# Patient Record
Sex: Female | Born: 1955
Health system: Southern US, Community
[De-identification: ages and names within clinical notes are randomized; demographics above are authoritative.]

## PROBLEM LIST (undated history)

## (undated) DIAGNOSIS — R079 Chest pain, unspecified: Secondary | ICD-10-CM

## (undated) DIAGNOSIS — F329 Major depressive disorder, single episode, unspecified: Secondary | ICD-10-CM

## (undated) DIAGNOSIS — F32A Depression, unspecified: Secondary | ICD-10-CM

## (undated) DIAGNOSIS — Z8249 Family history of ischemic heart disease and other diseases of the circulatory system: Secondary | ICD-10-CM

## (undated) DIAGNOSIS — E079 Disorder of thyroid, unspecified: Secondary | ICD-10-CM

## (undated) HISTORY — PX: EYE SURGERY: SHX253

## (undated) HISTORY — DX: Family history of ischemic heart disease and other diseases of the circulatory system: Z82.49

## (undated) HISTORY — PX: CATARACT EXTRACTION: SUR2

## (undated) HISTORY — PX: ABDOMINAL HYSTERECTOMY: SHX81

## (undated) HISTORY — DX: Chest pain, unspecified: R07.9

## (undated) HISTORY — PX: CARPAL TUNNEL RELEASE: SHX101

---

## 1998-08-08 ENCOUNTER — Other Ambulatory Visit: Admission: RE | Admit: 1998-08-08 | Discharge: 1998-08-08 | Payer: Self-pay | Admitting: Obstetrics and Gynecology

## 1998-09-15 ENCOUNTER — Other Ambulatory Visit: Admission: RE | Admit: 1998-09-15 | Discharge: 1998-09-15 | Payer: Self-pay | Admitting: Obstetrics and Gynecology

## 1999-01-31 ENCOUNTER — Ambulatory Visit (HOSPITAL_COMMUNITY): Admission: RE | Admit: 1999-01-31 | Discharge: 1999-01-31 | Payer: Self-pay | Admitting: Obstetrics and Gynecology

## 1999-01-31 ENCOUNTER — Encounter (INDEPENDENT_AMBULATORY_CARE_PROVIDER_SITE_OTHER): Payer: Self-pay | Admitting: Specialist

## 1999-04-10 ENCOUNTER — Encounter (INDEPENDENT_AMBULATORY_CARE_PROVIDER_SITE_OTHER): Payer: Self-pay | Admitting: Specialist

## 1999-04-10 ENCOUNTER — Inpatient Hospital Stay (HOSPITAL_COMMUNITY): Admission: RE | Admit: 1999-04-10 | Discharge: 1999-04-11 | Payer: Self-pay | Admitting: Obstetrics and Gynecology

## 2000-05-20 ENCOUNTER — Encounter: Admission: RE | Admit: 2000-05-20 | Discharge: 2000-05-20 | Payer: Self-pay | Admitting: Endocrinology

## 2000-05-20 ENCOUNTER — Encounter: Payer: Self-pay | Admitting: Endocrinology

## 2001-02-27 ENCOUNTER — Other Ambulatory Visit: Admission: RE | Admit: 2001-02-27 | Discharge: 2001-02-27 | Payer: Self-pay | Admitting: Obstetrics and Gynecology

## 2002-05-15 ENCOUNTER — Other Ambulatory Visit: Admission: RE | Admit: 2002-05-15 | Discharge: 2002-05-15 | Payer: Self-pay | Admitting: Obstetrics and Gynecology

## 2003-02-26 ENCOUNTER — Other Ambulatory Visit: Admission: RE | Admit: 2003-02-26 | Discharge: 2003-02-26 | Payer: Self-pay | Admitting: Obstetrics and Gynecology

## 2003-07-19 ENCOUNTER — Encounter (INDEPENDENT_AMBULATORY_CARE_PROVIDER_SITE_OTHER): Payer: Self-pay | Admitting: *Deleted

## 2003-07-19 ENCOUNTER — Ambulatory Visit (HOSPITAL_COMMUNITY): Admission: RE | Admit: 2003-07-19 | Discharge: 2003-07-19 | Payer: Self-pay | Admitting: *Deleted

## 2004-06-03 ENCOUNTER — Other Ambulatory Visit: Admission: RE | Admit: 2004-06-03 | Discharge: 2004-06-03 | Payer: Self-pay | Admitting: Obstetrics and Gynecology

## 2004-12-04 ENCOUNTER — Encounter: Admission: RE | Admit: 2004-12-04 | Discharge: 2004-12-04 | Payer: Self-pay | Admitting: Endocrinology

## 2007-02-28 ENCOUNTER — Encounter: Admission: RE | Admit: 2007-02-28 | Discharge: 2007-02-28 | Payer: Self-pay | Admitting: Obstetrics and Gynecology

## 2007-11-10 ENCOUNTER — Ambulatory Visit (HOSPITAL_BASED_OUTPATIENT_CLINIC_OR_DEPARTMENT_OTHER): Admission: RE | Admit: 2007-11-10 | Discharge: 2007-11-10 | Payer: Self-pay | Admitting: Orthopedic Surgery

## 2010-04-25 ENCOUNTER — Encounter: Payer: Self-pay | Admitting: Obstetrics and Gynecology

## 2010-04-26 ENCOUNTER — Encounter: Payer: Self-pay | Admitting: Obstetrics and Gynecology

## 2010-04-27 ENCOUNTER — Encounter: Payer: Self-pay | Admitting: Obstetrics and Gynecology

## 2010-08-17 ENCOUNTER — Emergency Department (HOSPITAL_COMMUNITY)
Admission: EM | Admit: 2010-08-17 | Discharge: 2010-08-18 | Disposition: A | Discharge status: Other Acute Inpatient Hospital | Payer: Commercial Managed Care - PPO | Attending: Emergency Medicine | Admitting: Emergency Medicine

## 2010-08-17 DIAGNOSIS — E039 Hypothyroidism, unspecified: Secondary | ICD-10-CM | POA: Insufficient documentation

## 2010-08-17 DIAGNOSIS — R45851 Suicidal ideations: Secondary | ICD-10-CM | POA: Insufficient documentation

## 2010-08-17 DIAGNOSIS — F329 Major depressive disorder, single episode, unspecified: Secondary | ICD-10-CM | POA: Insufficient documentation

## 2010-08-17 DIAGNOSIS — F3289 Other specified depressive episodes: Secondary | ICD-10-CM | POA: Insufficient documentation

## 2010-08-17 DIAGNOSIS — F331 Major depressive disorder, recurrent, moderate: Secondary | ICD-10-CM

## 2010-08-17 LAB — BASIC METABOLIC PANEL WITH GFR
CO2: 26 meq/L (ref 19–32)
Chloride: 104 meq/L (ref 96–112)
Creatinine, Ser: 1 mg/dL (ref 0.4–1.2)
GFR calc Af Amer: >60 mL/min (ref >60)
Glucose, Bld: 104 mg/dL — ABNORMAL HIGH (ref 70–99)

## 2010-08-17 LAB — RAPID URINE DRUG SCREEN, HOSP PERFORMED
Amphetamines: NOT DETECTED
Barbiturates: NOT DETECTED
Benzodiazepines: POSITIVE — AB
Cocaine: NOT DETECTED
Opiates: NOT DETECTED
Tetrahydrocannabinol: NOT DETECTED

## 2010-08-17 LAB — DIFFERENTIAL
Basophils Absolute: 0.1 K/uL (ref 0.0–0.1)
Basophils Relative: 2 % — ABNORMAL HIGH (ref 0–1)
Eosinophils Absolute: 0.1 10*3/uL (ref 0.0–0.7)
Eosinophils Relative: 2 % (ref 0–5)
Lymphocytes Relative: 23 % (ref 12–46)
Lymphs Abs: 1.3 K/uL (ref 0.7–4.0)
Monocytes Absolute: 0.3 K/uL (ref 0.1–1.0)
Monocytes Relative: 5 % (ref 3–12)
Neutro Abs: 3.8 K/uL (ref 1.7–7.7)
Neutrophils Relative %: 69 % (ref 43–77)

## 2010-08-17 LAB — BASIC METABOLIC PANEL
BUN: 18 mg/dL (ref 6–23)
Calcium: 9.2 mg/dL (ref 8.4–10.5)
GFR calc non Af Amer: 58 mL/min — ABNORMAL LOW (ref >60)
Potassium: 4.8 mEq/L (ref 3.5–5.1)
Sodium: 139 mEq/L (ref 135–145)

## 2010-08-17 LAB — CBC
HCT: 41.9 % (ref 36.0–46.0)
Hemoglobin: 14.7 g/dL (ref 12.0–15.0)
MCH: 30.4 pg (ref 26.0–34.0)
MCHC: 35.1 g/dL (ref 30.0–36.0)
MCV: 86.6 fL (ref 78.0–100.0)
Platelets: 333 10*3/uL (ref 150–400)
RBC: 4.84 MIL/uL (ref 3.87–5.11)
RDW: 12.1 % (ref 11.5–15.5)
WBC: 5.5 10*3/uL (ref 4.0–10.5)

## 2010-08-17 LAB — ETHANOL: Alcohol, Ethyl (B): <11 mg/dL — ABNORMAL HIGH (ref 0–10)

## 2010-08-17 LAB — URINALYSIS, ROUTINE W REFLEX MICROSCOPIC
Bilirubin Urine: NEGATIVE
Glucose, UA: NEGATIVE mg/dL
Hgb urine dipstick: NEGATIVE
Ketones, ur: NEGATIVE mg/dL
Nitrite: NEGATIVE
Protein, ur: NEGATIVE mg/dL
Specific Gravity, Urine: 1.033 — ABNORMAL HIGH (ref 1.005–1.030)
Urobilinogen, UA: 0.2 mg/dL (ref 0.0–1.0)
pH: 5.5 (ref 5.0–8.0)

## 2010-08-18 ENCOUNTER — Inpatient Hospital Stay (HOSPITAL_COMMUNITY)
Admission: RE | Admit: 2010-08-18 | Discharge: 2010-08-19 | DRG: 885 | Disposition: A | Discharge status: Home / Self Care | Payer: Commercial Managed Care - PPO | Source: Ambulatory Visit | Attending: Psychiatry | Admitting: Psychiatry

## 2010-08-18 DIAGNOSIS — R45851 Suicidal ideations: Secondary | ICD-10-CM

## 2010-08-18 DIAGNOSIS — E039 Hypothyroidism, unspecified: Secondary | ICD-10-CM | POA: Diagnosis present

## 2010-08-18 DIAGNOSIS — F339 Major depressive disorder, recurrent, unspecified: Secondary | ICD-10-CM

## 2010-08-18 DIAGNOSIS — F332 Major depressive disorder, recurrent severe without psychotic features: Principal | ICD-10-CM | POA: Diagnosis present

## 2010-08-18 DIAGNOSIS — F17200 Nicotine dependence, unspecified, uncomplicated: Secondary | ICD-10-CM | POA: Diagnosis present

## 2010-08-18 NOTE — Op Note (Signed)
NAMEEARNIE, ROCKHOLD              ACCOUNT NO.:  [id number]   MEDICAL RECORD NO.:  [id number]          PATIENT TYPE:  AMB   LOCATION:  DSC                          FACILITY:  [facility]   PHYSICIAN:  Katy Fitch. Sypher, M.D. DATE OF BIRTH:  [birth year]   DATE OF PROCEDURE:  11/10/2007  DATE OF DISCHARGE:                               OPERATIVE REPORT   PREOPERATIVE DIAGNOSIS:  Entrapment neuropathy median nerve right carpal  tunnel.   POSTOPERATIVE DIAGNOSIS:  Entrapment neuropathy median nerve right  carpal tunnel.   OPERATIONS:  Release of right transverse carpal ligament.   SURGEON:  Katy Fitch. Sypher, MD   ASSISTANT:  Annye Rusk PA-C   ANESTHESIA:  General by LMA.   SUPERVISING ANESTHESIOLOGIST:  Burna Forts, MD   INDICATIONS:  Kristina Shelton is a 55 year old woman referred through the  courtesy of Vangie Bicker of [facility] for  evaluation and management of hand numbness and discomfort.   Clinical examination suggested significant carpal tunnel syndrome and  electrodiagnostic studies confirmed median neuropathy at the level of  the wrist.   Due to a failure to respond to nonoperative measures, she is brought to  the operating room at this time for release of right transverse carpal  ligament.   PROCEDURE:  Kristina Shelton is brought to the operating room and placed  in supine position on the operating table.   Following induction of general anesthesia by LMA technique, the right  arm was prepped with Betadine soap solution and sterilely draped.  Upon  exsanguination of right arm with an Esmarch bandage, the arterial  tourniquet was inflated to 220 mmHg.  The procedure commenced with a  short incision in the line of the ring finger of the palm.  Subcutaneous  tissues were carefully divided revealing the palmar fascia.  This split  longitudinally to reveal the common sensory Runkles of the median nerve.  These were followed back to the  transverse carpal ligament which was  gently isolated from the median nerve.  The ligament was then released  along its ulnar border extending into the distal forearm.   This widely opened the carpal canal.  Bleeding points along the margin  of the released ligament were electrocauterized with bipolar forceps  followed by repair of the skin with intradermal 3-0 Prolene suture.   Lidocaine 2% was infiltrated for postoperative analgesia.  The wound was  then dressed with Xeroform, sterile gauze and a volar plaster splint.      Katy Fitch Sypher, M.D.  Electronically Signed     RVS/MEDQ  D:  11/10/2007  T:  11/10/2007  Job:  [id number]   cc:   Vangie Bicker, P.A.  Dr. Juleen China

## 2010-08-19 LAB — T4: T4, Total: 9.3 ug/dL (ref 5.0–12.5)

## 2010-08-19 LAB — TSH: TSH: 12.535 u[IU]/mL — ABNORMAL HIGH (ref 0.350–4.500)

## 2010-08-19 LAB — T3 UPTAKE: T3 Uptake Ratio: 29 % (ref 22.5–37.0)

## 2010-08-21 NOTE — H&P (Signed)
NAMEJERONDA, Kristina Shelton              ACCOUNT NO.:  [id number]  MEDICAL RECORD NO.:  [id number]           PATIENT TYPE:  I  LOCATION:  0500                          FACILITY:  [facility]  PHYSICIAN:  Franchot Gallo, MD     DATE OF BIRTH:  [birth year]  DATE OF ADMISSION:  08/18/2010 DATE OF DISCHARGE:                      PSYCHIATRIC ADMISSION ASSESSMENT   CHIEF COMPLAINT:  "I have been depressed and tried to kill myself."  HISTORY OF PRESENT ILLNESS:  Kristina Shelton is a 55 year old, divorced white female who presents to [facility] for treatment of longstanding depressive symptoms which she states have worsened over the past year. She states that prior to admission, she became sad related to her son "not interacting with her" and sent him an email stating that she was going to "end it all."  She reports that she then placed a gun to her chest but was interrupted by her son who received the email and came to her home to check on her safety.  She reports that her son was able to remove the gun from her hand and transport her to [facility] for admission.  Prior to admission, the patient states that she was sleeping reasonably well with a good appetite but reported moderate to severe feelings of sadness, anhedonia and depressed mood.  Again, she states that her depressive symptoms have worsened over the past year but have been present for many years.  She denies any past suicide attempts or gestures and states that she is unsure if she would have gone through with the attempt to kill herself had her son not intervened.  The patient reports moderate feelings of sadness, anhedonia and depressed mood as well as some feelings of helplessness and hopelessness related to her relationship with her son.  She did report that her son "hugged her" prior to her being admitted, and that has given her some hope that things may work out between the two of them.  The patient denies any past  or current prolonged manic or hypomanic symptoms.  She also denies any past or present auditory or visual hallucinations.  The patient denies any use of alcohol or illicit drugs but does state that she occasionally smokes a cigarette.  She presents for evaluation of the above symptoms.  PAST PSYCHIATRIC HISTORY:  The patient denies any past psychiatric hospitalizations.  She reports that she has taken the antidepressant Zoloft in the past which helped her with her depression but states that it had to be discontinued secondary to GI upset.  PAST MEDICAL HISTORY:  Current medications: 1. Synthroid 150 mcg p.o. q.a.m. 2. Klonopin 0.5 mg p.o. q.h.s. for sleep. 3. Multivitamin p.o. q.a.m. 4. Ibuprofen 200 mg p.o. q.8 hours p.r.n. for pain.  Allergies:  MACROBID - "Severe reaction."  Medical illnesses:  Hypothyroidism.  Past operations: 1. Cervical vertebrae fusion in neck. 2. Partial hysterectomy. 3. Tonsillectomy as child.  FAMILY HISTORY:  The patient's mother is 51 years of age and reportedly has a history of depression.  The patient's father died in 81 from heart disease.  She has one son who is 54 years of age and is  in good health.  She denies any other family history of psychiatric or substance abuse-related illnesses other than her mother.  SOCIAL HISTORY:  The patient was born and raised in [place], [place], and currently lives alone in [place].  She states that she completed the 12th grade of school and currently works as an Control and instrumentation engineer. Again, she is currently divorced and has one child.  She reports very occasionally smokes cigarettes but denies any use of alcohol or illicit drugs.  MENTAL STATUS EXAM:  GENERAL - The patient was alert and oriented x3 and was friendly and cooperative throughout the evaluation.  Speech was appropriate in terms of rate and volume.  Mood appeared moderately depressed today.  Affect was moderately constricted. THOUGHTS - The  patient denied any current auditory or visual hallucinations or delusional thinking.  She also denied any current suicidal or homicidal ideations.  Judgment and insight both appeared fair.  IMPRESSION:    Axis I: 1. Major depressive disorder - Recurrent - Moderate. 2. Dysthymic disorder - Late-onset type. 3. Tobacco abuse. Axis II:  Deferred. Axis III: 1. Hypothyroidism. 2. Status post fusion of cervical vertebrae. 3. Status post partial hysterectomy. 4. Status post tonsillectomy. Axis IV:  Limited primary support system.  Some discord with son. Chronic psychiatric illness. Axis V:  Global Assessment of Functioning at time of admission approximately 40.  Highest Global Assessment of Functioning in past year approximately 60.  PLAN: 1. Since the patient states that she is unable to tolerate the     medication Zoloft, this medication will not be restarted day. 2. The patient was started on the medication Celexa at 20 mg p.o.     q.a.m. to address her depressive symptoms. 3. The patient was continued on the medication Klonopin at 0.5 mg p.o.     q.h.s. as needed for sleep. 4. The patient will be continued on her nonpsychiatric medications of     Synthroid at 150 mcg p.o. q.a.m., multivitamin p.o. q.a.m. and     ibuprofen 200 mg p.o. q.8 hours p.r.n. for pain. 5. Considering the patient's history of hypothyroidism, a TSH, T3     uptake and T4 are also ordered. 6. The patient will continue to be monitored for safety to self and     others. 7. The patient will participate in unit activities per routine.    ___________________________________ Franchot Gallo, MD     RR/MEDQ  D:  08/18/2010  T:  08/18/2010  Job:  [id number]  Electronically Signed by Franchot Gallo MD on 08/21/2010 08:54:14 PM

## 2010-08-21 NOTE — Op Note (Signed)
Kristina Shelton, Kristina Shelton                        ACCOUNT NO.:  [id number]   MEDICAL RECORD NO.:  [id number]                   PATIENT TYPE:  AMB   LOCATION:  ENDO                                 FACILITY:  [facility]   PHYSICIAN:  Georgiana Spinner, M.D.                 DATE OF BIRTH:  1955/09/22   DATE OF PROCEDURE:  07/19/2003  DATE OF DISCHARGE:                                 OPERATIVE REPORT   PROCEDURE:  Upper endoscopy.   INDICATIONS:  Diarrhea, question of celiac disease, reflux, rectal bleeding.   ANESTHESIA:  1. Demerol 75 mg.  2. Versed 7 mg.   DESCRIPTION OF PROCEDURE:  With patient mildly sedated in the left lateral  decubitus position, the Olympus videoscopic pediatric colonoscope was  inserted in the mouth and passed under direct vision through the esophagus  into the stomach, into the duodenum and distally as far as I could get the  scope to pass which was quite distally into the small intestine.  Photographs taken along the way.  Biopsies were taken of the small bowel.  From this point, the colonoscope was then slowly withdrawn, taking  circumferential views of the mucosa visualized until the endoscope then  pulled back into the stomach, placed in retroflexion to view the stomach  from below.  The endoscope was then withdrawn, taking circumferential views  of the remaining gastric and esophagitis mucosa.  The patient's vital signs  and pulse oximeter remained stable.  The patient tolerated the procedure  well without apparent complications.   FINDINGS:  Unremarkable endoscopic examination to the small bowel.   PLAN:  1. Await biopsy report.  The patient will call me for results and follow up     with me as an outpatient.  2. Proceed to colonoscopy as planned.                                               Georgiana Spinner, M.D.    GMO/MEDQ  D:  07/19/2003  T:  07/19/2003  Job:  [id number]

## 2010-08-21 NOTE — Op Note (Signed)
Kristina Shelton, Kristina Shelton                        ACCOUNT NO.:  [id number]   MEDICAL RECORD NO.:  [id number]                   PATIENT TYPE:  AMB   LOCATION:  ENDO                                 FACILITY:  [facility]   PHYSICIAN:  Georgiana Spinner, M.D.                 DATE OF BIRTH:  April 22, 1955   DATE OF PROCEDURE:  07/19/2003  DATE OF DISCHARGE:                                 OPERATIVE REPORT   PROCEDURE:  Colonoscopy.   INDICATIONS:  Diarrhea, rectal bleeding.   DESCRIPTION OF PROCEDURE:  With the patient mildly sedated in the left  lateral decubitus position, the Olympus videoscopic pediatric colonoscope  was inserted in the rectum and passed under direct vision to the cecum,  identified by the base of cecum and the ileocecal valve, both of which were  photographed.  We entered into the terminal ileum, then advanced the  endoscope as far as we could.  Photographs and biopsies taken.  The  endoscope was then withdrawn, taking circumferential views of the remaining  small bowel and colonic mucosa, stopping to take random colonic biopsies  along the way as well until we reached the rectum which appeared normal on  direct, showed hemorrhoids on retroflexed view.  The endoscope was  straightened and withdrawn.  The patient's vital signs and pulse oximeter  remained stable.  The patient tolerated the procedure well without apparent  complications.   FINDINGS:  Internal hemorrhoids, otherwise unremarkable colonoscopic  examination including the terminal ileum.   PLAN:  Await biopsy report.  The patient will call me for results and follow  up with me as an outpatient.                                               Georgiana Spinner, M.D.    GMO/MEDQ  D:  07/19/2003  T:  07/19/2003  Job:  [id number]

## 2011-01-01 LAB — POCT HEMOGLOBIN-HEMACUE: Hemoglobin: 14

## 2012-05-15 ENCOUNTER — Emergency Department (HOSPITAL_COMMUNITY)
Admission: EM | Admit: 2012-05-15 | Discharge: 2012-05-15 | Disposition: A | Discharge status: Home / Self Care | Payer: Commercial Managed Care - PPO | Attending: Emergency Medicine | Admitting: Emergency Medicine

## 2012-05-15 ENCOUNTER — Emergency Department (HOSPITAL_COMMUNITY): Payer: Commercial Managed Care - PPO

## 2012-05-15 ENCOUNTER — Encounter (HOSPITAL_COMMUNITY): Payer: Self-pay | Admitting: *Deleted

## 2012-05-15 DIAGNOSIS — R079 Chest pain, unspecified: Secondary | ICD-10-CM

## 2012-05-15 DIAGNOSIS — F3289 Other specified depressive episodes: Secondary | ICD-10-CM | POA: Insufficient documentation

## 2012-05-15 DIAGNOSIS — Z79899 Other long term (current) drug therapy: Secondary | ICD-10-CM | POA: Insufficient documentation

## 2012-05-15 DIAGNOSIS — R0789 Other chest pain: Secondary | ICD-10-CM | POA: Insufficient documentation

## 2012-05-15 DIAGNOSIS — F329 Major depressive disorder, single episode, unspecified: Secondary | ICD-10-CM | POA: Insufficient documentation

## 2012-05-15 HISTORY — DX: Depression, unspecified: F32.A

## 2012-05-15 HISTORY — DX: Major depressive disorder, single episode, unspecified: F32.9

## 2012-05-15 HISTORY — DX: Disorder of thyroid, unspecified: E07.9

## 2012-05-15 LAB — BASIC METABOLIC PANEL
Chloride: 108 mEq/L (ref 96–112)
GFR calc Af Amer: 88 mL/min — ABNORMAL LOW (ref >90)
GFR calc non Af Amer: 76 mL/min — ABNORMAL LOW (ref >90)
Potassium: 4.1 mEq/L (ref 3.5–5.1)
Sodium: 144 mEq/L (ref 135–145)

## 2012-05-15 LAB — CBC
HCT: 35.3 % — ABNORMAL LOW (ref 36.0–46.0)
MCHC: 35.7 g/dL (ref 30.0–36.0)
Platelets: 336 10*3/uL (ref 150–400)
RDW: 12.4 % (ref 11.5–15.5)
WBC: 5.3 10*3/uL (ref 4.0–10.5)

## 2012-05-15 LAB — HEPATIC FUNCTION PANEL
Albumin: 3.9 g/dL (ref 3.5–5.2)
Alkaline Phosphatase: 104 U/L (ref 39–117)
Total Protein: 6.8 g/dL (ref 6.0–8.3)

## 2012-05-15 LAB — TROPONIN I: Troponin I: <0.3 ng/mL (ref <0.30)

## 2012-05-15 LAB — LIPASE, BLOOD: Lipase: 56 U/L (ref 11–59)

## 2012-05-15 LAB — D-DIMER, QUANTITATIVE: D-Dimer, Quant: <0.27 ug/mL-FEU (ref 0.00–0.48)

## 2012-05-15 MED ORDER — ASPIRIN 81 MG PO CHEW: 324.0000 mg | CHEWABLE_TABLET | Freq: Once | ORAL | Status: DC

## 2012-05-15 NOTE — ED Notes (Signed)
Patient states on/off chest pain x almost 1 year, but over past week chest pain has intensified with pain in central chest running into back. Patient with intermittent episodes of chest pressure

## 2012-05-15 NOTE — ED Notes (Signed)
Pt undressed, in gown, on monitor, continuous pulse oximetry and blood pressure cuff; vitals and EKG being performed 

## 2012-05-15 NOTE — ED Provider Notes (Signed)
History     CSN: [id number]  Arrival date & time 05/15/12  1056   First MD Initiated Contact with Patient 05/15/12 1134      Chief Complaint  Patient presents with  . Chest Pain    (Consider location/radiation/quality/duration/timing/severity/associated sxs/prior treatment) HPI Pt presents with c/o chest pain- she states pain began 3 days ago.  She states it is located in her lower mid chest and radiates to upper back and shoulders.  No nausea, no diaphoressis.  Denies shortness of breath. States she has had similar symptoms several years ago and did not seek medical care.  No fever/cough.  Symptoms are not exertional in nature.  No leg swelling, no hx of DVT/PE, no hormone replacement, no recent travel/trauma/surgery.  She was seen in her PMDs office this morning and was advised to come to the ED for further evaluation.  There are no other associated systemic symptoms, there are no other alleviating or modifying factors.   Past Medical History  Diagnosis Date  . Depression   . Thyroid disease     Past Surgical History  Procedure Laterality Date  . Abdominal hysterectomy    . Cataract extraction    . Carpal tunnel release      No family history on file.  History  Substance Use Topics  . Smoking status: Never Smoker   . Smokeless tobacco: Not on file  . Alcohol Use: No    OB History   Grav Para Term Preterm Abortions TAB SAB Ect Mult Living                  Review of Systems ROS reviewed and all otherwise negative except for mentioned in HPI  Allergies  Macrobid  Home Medications   Current Outpatient Rx  Name  Route  Sig  Dispense  Refill  . Ascorbic Acid (VITAMIN C PO)   Oral   Take 1 tablet by mouth daily.         . calcium carbonate (OS-CAL) 600 MG TABS   Oral   Take 600 mg by mouth daily.         . citalopram (CELEXA) 20 MG tablet   Oral   Take 20 mg by mouth at bedtime.         . clonazePAM (KLONOPIN) 1 MG tablet   Oral   Take 0.5-1 mg  by mouth at bedtime. Take 0.5 tablet Mon - Fri  And take 1 tablet on Sat and Sun.         Marland Kitchen ibuprofen (ADVIL,MOTRIN) 200 MG tablet   Oral   Take 200 mg by mouth every 8 (eight) hours as needed for pain.         Marland Kitchen levothyroxine (SYNTHROID, LEVOTHROID) 150 MCG tablet   Oral   Take 150 mcg by mouth daily.         . LUTEIN PO   Oral   Take 1 tablet by mouth daily.         . Multiple Vitamin (MULTIVITAMIN WITH MINERALS) TABS   Oral   Take 1 tablet by mouth daily.         Marland Kitchen OVER THE COUNTER MEDICATION   Oral   Take 1 tablet by mouth daily as needed. For acid reflux  -  [employer] brand         . VITAMIN E PO   Oral   Take 1 tablet by mouth daily.           BP  109/65  Pulse 85  Temp(Src) 98.5 F (36.9 C) (Oral)  Resp 19  SpO2 98% Vitals reviewed Physical Exam Physical Examination: General appearance - alert, well appearing, and in no distress Mental status - alert, oriented to person, place, and time Eyes - no conjunctival injection, no scleral icterus Mouth - mucous membranes moist, pharynx normal without lesions Chest - clear to auscultation, no wheezes, rales or rhonchi, symmetric air entry Heart - normal rate, regular rhythm, normal S1, S2, no murmurs, rubs, clicks or gallops Abdomen - soft, nontender, nondistended, no masses or organomegaly Extremities - peripheral pulses normal, no pedal edema, no clubbing or cyanosis Skin - normal coloration and turgor, no rashes  ED Course  Procedures (including critical care time)   Date: 05/15/2012  Rate: 84  Rhythm: normal sinus rhythm  QRS Axis: normal  Intervals: normal  ST/T Wave abnormalities: normal  Conduction Disutrbances: none  Narrative Interpretation: unremarkable, right ward axis, no significant changes compared to prior ekg     Labs Reviewed  CBC - Abnormal; Notable for the following:    HCT 35.3 (*)    All other components within normal limits  BASIC METABOLIC PANEL - Abnormal; Notable for  the following:    GFR calc non Af Amer 76 (*)    GFR calc Af Amer 88 (*)    All other components within normal limits  HEPATIC FUNCTION PANEL - Abnormal; Notable for the following:    Total Bilirubin 0.2 (*)    All other components within normal limits  TROPONIN I  D-DIMER, QUANTITATIVE  LIPASE, BLOOD   Dg Chest 2 View  05/15/2012  *RADIOLOGY REPORT*  Clinical Data: Intermittent mid chest pain.  Shortness of breath.  CHEST - 2 VIEW  Comparison: None.  Findings: The heart size is normal.  The lungs are clear.  The visualized soft tissues and bony thorax are unremarkable.  IMPRESSION: Negative chest.   Original Report Authenticated By: Marin Roberts, M.D.      1. Chest pain       MDM  Pt presents with c/o chest pain which has been intermittent over the past year- worse over the past 3 days.  EKG reassuring as well as labs including d-dimer.  CXR shows no acute abnormality.  Also obtained belly labs due to radiation to shoulders but these were normal as well.  Low suspicion for PE, ACS or other acute emergent condition at this time. Discharged with strict return precautions.  Pt agreeable with plan.        Ethelda Chick, MD 05/15/12 [phone]

## 2012-05-20 ENCOUNTER — Other Ambulatory Visit: Payer: Self-pay

## 2012-06-06 ENCOUNTER — Other Ambulatory Visit (HOSPITAL_COMMUNITY): Payer: Self-pay | Admitting: Cardiovascular Disease

## 2012-06-06 DIAGNOSIS — R079 Chest pain, unspecified: Secondary | ICD-10-CM

## 2012-06-20 ENCOUNTER — Ambulatory Visit (HOSPITAL_COMMUNITY)
Admission: RE | Admit: 2012-06-20 | Discharge: 2012-06-20 | Disposition: A | Discharge status: Home / Self Care | Payer: Commercial Managed Care - PPO | Source: Ambulatory Visit | Attending: Cardiovascular Disease | Admitting: Cardiovascular Disease

## 2012-06-20 DIAGNOSIS — R079 Chest pain, unspecified: Secondary | ICD-10-CM | POA: Insufficient documentation

## 2012-06-20 HISTORY — PX: NM MYOVIEW LTD: HXRAD82

## 2012-06-20 MED ORDER — TECHNETIUM TC 99M SESTAMIBI GENERIC - CARDIOLITE
31.0000 | Freq: Once | INTRAVENOUS | Status: AC | PRN
  Administered 2012-06-20: 31 via INTRAVENOUS

## 2012-06-20 MED ORDER — TECHNETIUM TC 99M SESTAMIBI GENERIC - CARDIOLITE
11.0000 | Freq: Once | INTRAVENOUS | Status: AC | PRN
  Administered 2012-06-20: 11 via INTRAVENOUS

## 2012-06-20 NOTE — Procedures (Addendum)
[facility] [address] [place] [address] [phone]  Cardiology Nuclear Med Study  Kristina Shelton is a 57 y.o. female     MRN : [id number]     DOB: 1955-09-29  Procedure Date: 06/20/2012  Nuclear Med Background Indication for Stress Test:  Evaluation for Ischemia History:  no prior cardiac hisory Cardiac Risk Factors: Family History - CAD  Symptoms:  Chest Pain, Dizziness, DOE and Fatigue   Nuclear Pre-Procedure Caffeine/Decaff Intake:  11:00pm NPO After: 11:00am   IV Site: R Hand  IV 0.9% NS with Angio Cath:  22g  Chest Size (in):  n/a IV Started by: Koren Shiver, CNMT  Height: 5\' 2"  (1.575 m)  Cup Size: A  BMI:  Body mass index is 25.23 kg/(m^2). Weight:  138 lb (62.596 kg)   Tech Comments:  n/a    Nuclear Med Study 1 or 2 day study: 1 day  Stress Test Type:  Stress  Order Authorizing Provider:  Thurmon Fair, MD   Resting Radionuclide: Technetium 23m Sestamibi  Resting Radionuclide Dose: 11.0 mCi   Stress Radionuclide:  Technetium 20m Sestamibi  Stress Radionuclide Dose: 31.0 mCi           Stress Protocol Rest HR:72 Stress HR: 157  Rest BP: 105/78 Stress BP: 148/61  Exercise Time (min): 9:46 METS: 11.30          Dose of Adenosine (mg):  n/a Dose of Lexiscan: n/a mg  Dose of Atropine (mg): n/a Dose of Dobutamine: n/a mcg/kg/min (at max HR)  Stress Test Technologist: Ernestene Mention, CCT Nuclear Technologist: Gonzella Lex, CNMT   Rest Procedure:  Myocardial perfusion imaging was performed at rest 45 minutes following the intravenous administration of Technetium 71m Sestamibi. Stress Procedure:  The patient performed treadmill exercise using a Bruce  Protocol for 9 minutes. The patient stopped due to fatigue. Patient denied any chest pain.  There were no significant ST-T wave changes.  Technetium 44m Sestamibi was injected at peak exercise and myocardial perfusion imaging was performed after a brief  delay.  Transient Ischemic Dilatation (Normal <1.22):  0.88 Lung/Heart Ratio (Normal <0.45): 0.37 QGS EDV: 51 ml QGS ESV:  13 ml LV Ejection Fraction: 75%  Signed by      Rest ECG: NSR - Normal EKG  Stress ECG: No significant change from baseline ECG  QPS Raw Data Images:  Normal; no motion artifact; normal heart/lung ratio. Stress Images:  Normal homogeneous uptake in all areas of the myocardium. Rest Images:  Normal homogeneous uptake in all areas of the myocardium. Subtraction (SDS):  Normal  Impression Exercise Capacity:  Good exercise capacity. BP Response:  Normal blood pressure response. Clinical Symptoms:  No significant symptoms noted. ECG Impression:  No significant ST segment change suggestive of ischemia. Comparison with Prior Nuclear Study: No previous nuclear study performed  Overall Impression:  Normal stress nuclear study. Low risk stress nuclear study.  LV Wall Motion:  NL LV Function, EF 75%; NL Wall Motion   KELLY,THOMAS A, MD  06/20/2012 5:53 PM

## 2012-07-31 ENCOUNTER — Encounter: Payer: Self-pay | Admitting: *Deleted

## 2012-08-03 ENCOUNTER — Encounter: Payer: Self-pay | Admitting: Cardiovascular Disease

## 2013-02-08 ENCOUNTER — Other Ambulatory Visit: Payer: Self-pay

## 2014-08-22 IMAGING — CR DG CHEST 2V
2 series · 2 of 2 positions shown · non-contrast
Comparison: None.

CLINICAL DATA: Intermittent mid chest pain.  Shortness of breath.

CHEST - 2 VIEW

[w chest pa]
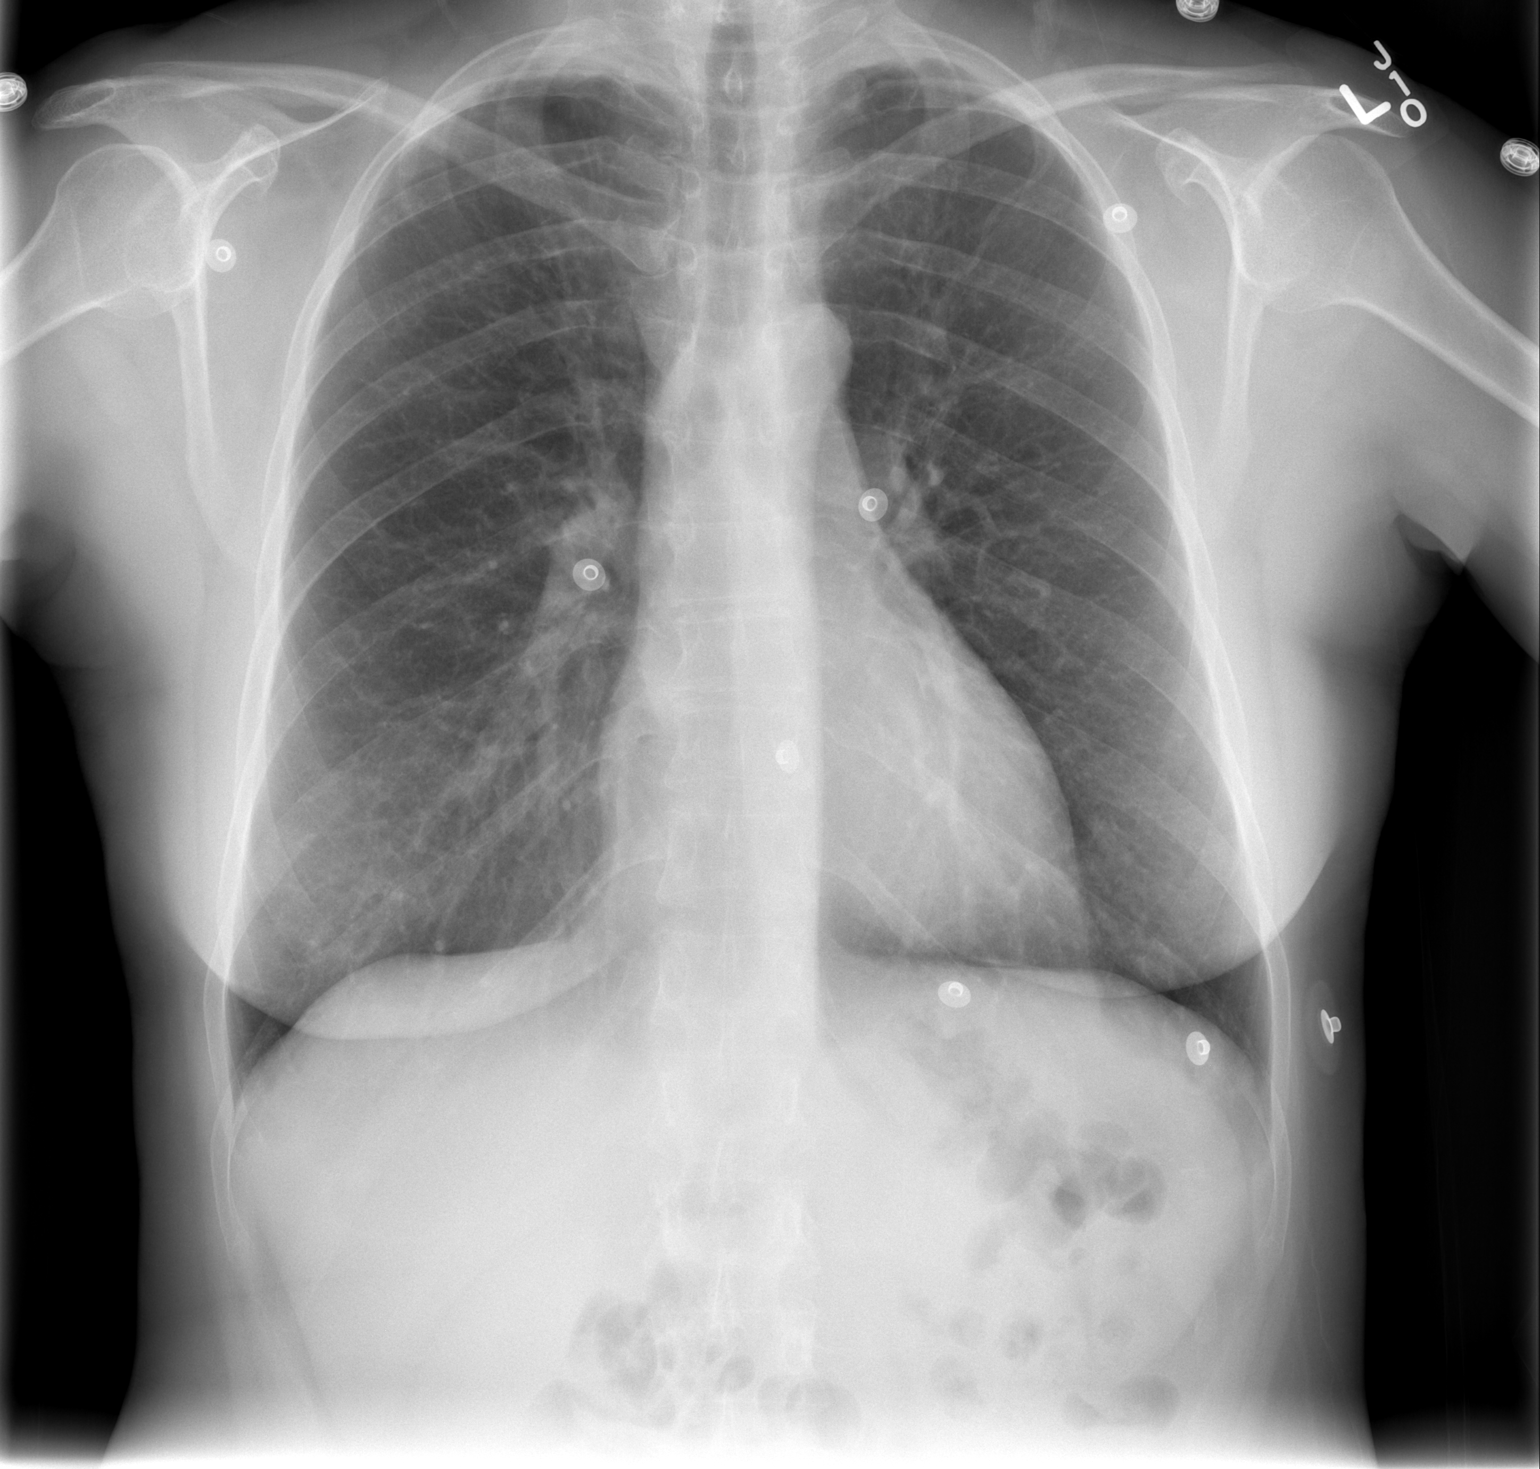

[w chest lat]
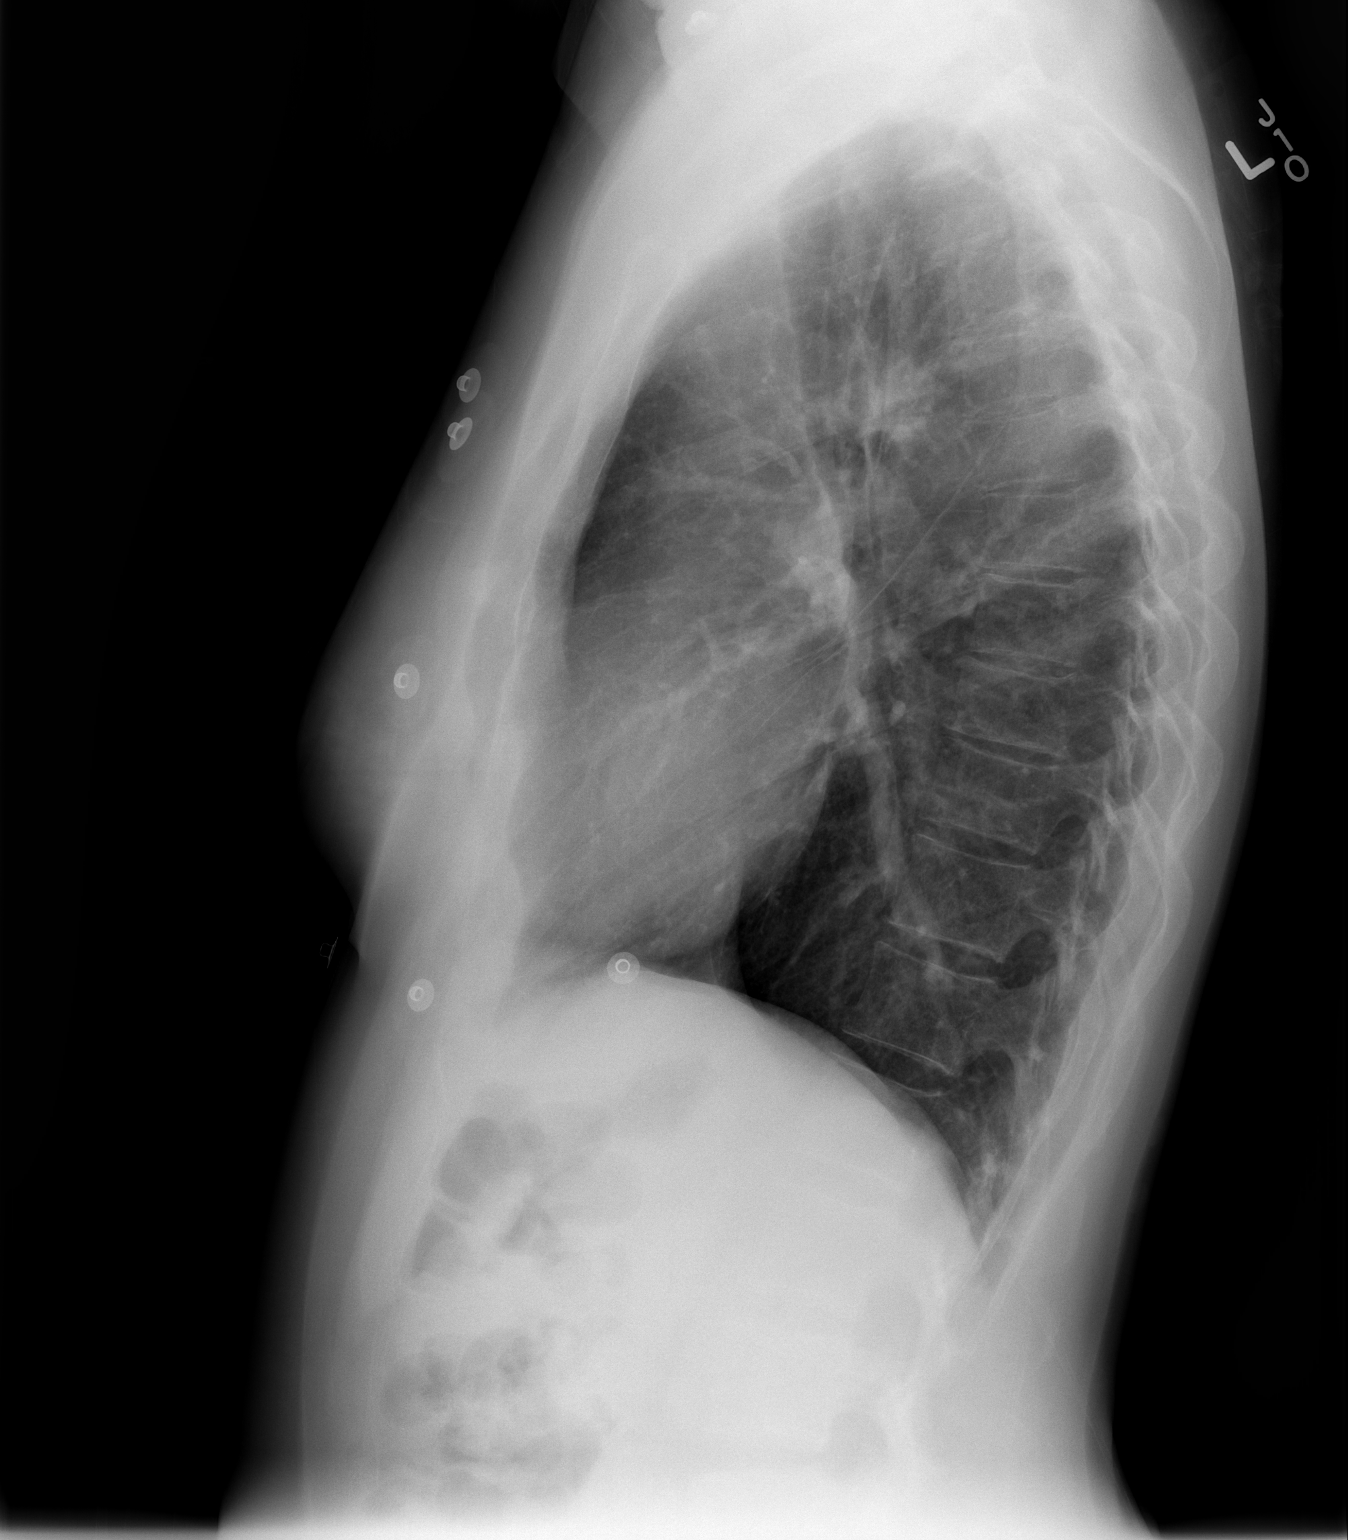

[2 of 2 positions shown; findings below may reference images not displayed]

FINDINGS: The heart size is normal.  The lungs are clear.  The
visualized soft tissues and bony thorax are unremarkable.
IMPRESSION: Negative chest.

## 2017-09-06 ENCOUNTER — Encounter (INDEPENDENT_AMBULATORY_CARE_PROVIDER_SITE_OTHER): Payer: Self-pay | Admitting: Ophthalmology

## 2017-09-06 ENCOUNTER — Ambulatory Visit (INDEPENDENT_AMBULATORY_CARE_PROVIDER_SITE_OTHER): Payer: BLUE CROSS/BLUE SHIELD | Admitting: Ophthalmology

## 2017-09-06 DIAGNOSIS — Z961 Presence of intraocular lens: Secondary | ICD-10-CM

## 2017-09-06 DIAGNOSIS — H3581 Retinal edema: Secondary | ICD-10-CM

## 2017-09-06 DIAGNOSIS — H43811 Vitreous degeneration, right eye: Secondary | ICD-10-CM | POA: Diagnosis not present

## 2017-09-06 NOTE — Progress Notes (Signed)
[facility] - Clinic Note  09/06/2017     CHIEF COMPLAINT Patient presents for Retina Evaluation   HISTORY OF PRESENT ILLNESS: Kristina Shelton is a 62 y.o. female who presents to the clinic today for:   HPI    Retina Evaluation    In both eyes.  This started 2 days ago.  Associated Symptoms Floaters and Flashes.  Negative for Blind Spot, Photophobia, Scalp Tenderness, Fever, Weight Loss, Jaw Claudication, Glare, Pain, Distortion, Redness, Trauma, Shoulder/Hip pain and Fatigue.  Context:  distance vision, mid-range vision and near vision.  Treatments tried include surgery.  Response to treatment was mild improvement.  I, the attending physician,  performed the HPI with the patient and updated documentation appropriately.          Comments    Referral of DR. Le for retina evaluation. Patient states Sunday (09/04/17) in late afternoon she noticed abundance of floaters and flashes OD. Pt reports her BP has been fluctuating since January( 166/119) , she is monitoring it at this time per DR. Erby Pian PCP. Pt  had cataract sx Ou appx 10 yrs ago with mild improvement , she states she was using readers after sx and short afterwards she needed  prescription glasses. Pt is using Soothe eye Gtt's PRN and taking multivitamins QD         Last edited by Rennis Chris, MD on 09/06/2017  3:54 PM. (History)    Pt states she was seen by Dr. Conley Rolls for floaters and flashes; Pt states floaters and flashes in OD; Pt states this began x 3 days ago; Pt states she has been experiencing floaters and flashes consistently since Sunday; Pt states she is now seeing "a snow storm" of floaters; Pt states there is a type of "glaze" over OD [place]; Pt denies any curtain over [place];   Referring physician: Conley Rolls [place], [place] [address] [place], [place] [address]  HISTORICAL INFORMATION:   Selected notes from the MEDICAL RECORD NUMBER Referred by Dr. Aletta Edouard for concern of flashes and floaters LEE: (M. Le) Ocular  Hx- PMH-CAD    CURRENT MEDICATIONS: No current outpatient medications on file. (Ophthalmic Drugs)   No current facility-administered medications for this visit.  (Ophthalmic Drugs)   Current Outpatient Medications (Other)  Medication Sig  . Ascorbic Acid (VITAMIN C PO) Take 1 tablet by mouth daily.  Marland Kitchen aspirin 81 MG tablet Take 81 mg by mouth daily.  . calcium carbonate (OS-CAL) 600 MG TABS Take 600 mg by mouth daily.  . citalopram (CELEXA) 20 MG tablet Take 20 mg by mouth at bedtime.  . clonazePAM (KLONOPIN) 1 MG tablet Take 0.5-1 mg by mouth at bedtime. Take 0.5 tablet Mon - Fri  And take 1 tablet on Sat and Sun.  Marland Kitchen ibuprofen (ADVIL,MOTRIN) 200 MG tablet Take 200 mg by mouth every 8 (eight) hours as needed for pain.  Marland Kitchen levothyroxine (SYNTHROID, LEVOTHROID) 150 MCG tablet Take 150 mcg by mouth daily.  . Multiple Vitamin (MULTIVITAMIN WITH MINERALS) TABS Take 1 tablet by mouth daily.  Marland Kitchen OVER THE COUNTER MEDICATION Take 1 tablet by mouth daily as needed. For acid reflux  -  [employer] brand  . VITAMIN E PO Take 1 tablet by mouth daily.  . LUTEIN PO Take 1 tablet by mouth daily.   No current facility-administered medications for this visit.  (Other)      REVIEW OF SYSTEMS: ROS    Positive for: Eyes   Negative for:  Constitutional, Gastrointestinal, Neurological, Skin, Genitourinary, Musculoskeletal, HENT, Endocrine, Cardiovascular, Respiratory, Psychiatric, Allergic/Imm, Heme/Lymph   Last edited by Eldridge Scot, LPN on 04/10/1094  8:33 AM. (History)       ALLERGIES Allergies  Allergen Reactions  . Macrobid [Nitrofurantoin Macrocrystal]     [employer]    PAST MEDICAL HISTORY Past Medical History:  Diagnosis Date  . Chest pain   . Depression   . F/H: CAD (coronary artery disease)   . Thyroid disease    Past Surgical History:  Procedure Laterality Date  . ABDOMINAL HYSTERECTOMY    . CARPAL TUNNEL RELEASE    . CATARACT EXTRACTION    . EYE SURGERY    . NM  MYOVIEW LTD  06/20/2012   normal    FAMILY HISTORY Family History  Problem Relation Age of Onset  . Heart attack Father   . Heart attack Brother     SOCIAL HISTORY Social History   Tobacco Use  . Smoking status: Never Smoker  . Smokeless tobacco: Never Used  Substance Use Topics  . Alcohol use: No  . Drug use: Never         OPHTHALMIC EXAM:  Base Eye Exam    Visual Acuity (Snellen - Linear)      Right Left   Dist [place] 20/40 20/30   Dist ph [place] 20/30 +2 20/25 +2       Tonometry (Tonopen, 9:23 AM)      Right Left   Pressure 19 18       Pupils      Dark Light Shape React APD   Right 4 3 Round Brisk None   Left 4 3 Round Brisk None       Visual Fields (Counting fingers)      Left Right    Full Full       Extraocular Movement      Right Left    Full, Ortho Full, Ortho       Neuro/Psych    Oriented x3:  Yes   Mood/Affect:  Normal       Dilation    Both eyes:  1.0% Mydriacyl, Paremyd @ 9:24 AM        Slit Lamp and Fundus Exam    Slit Lamp Exam      Right Left   Lids/Lashes Dermatochalasis - upper lid Dermatochalasis - upper lid   Conjunctiva/Sclera White and quiet White and quiet   Cornea Arcus, Temporal Well healed cataract wounds Arcus, Inferior 1+ Punctate epithelial erosions   Anterior Chamber Deep and quiet Deep and quiet   Iris Round and dilated Round and dilated   Lens PC IOL in good position with open PC PC IOL in good position with open PC   Vitreous Vitreous syneresis, no tobacco dust, Posterior vitreous detachment Vitreous syneresis       Fundus Exam      Right Left   Disc Pink and Sharp Pink and Sharp, mild Inferior Peripapillary atrophy   C/D Ratio 0.4 0.4   Macula Mildly Blunted foveal reflex, mild Retinal pigment epithelial mottling, No heme or edema Blunted foveal reflex, Retinal pigment epithelial mottling, No heme or edema   Vessels Vascular attenuation Vascular attenuation   Periphery Attached, VR tuft at 0400 ora, punctate  pre-retinal heme at 0900, No RT/RD on 360 scleral depressed exam Attached        Refraction    Wearing Rx      Sphere Cylinder Axis Add   Right -0.75 +0.50 057 +  2.50   Left +0.50 +0.50 100 +2.50   Type:  PAL          IMAGING AND PROCEDURES  Imaging and Procedures for @TODAY @  OCT, Retina - OU - Both Eyes       Right Eye Quality was good. Central Foveal Thickness: 278. Progression has no prior data. Findings include normal foveal contour, no IRF, no SRF (Tr ERM).   Left Eye Quality was good. Central Foveal Thickness: 278. Progression has no prior data. Findings include normal foveal contour, no IRF, no SRF (Tr ERM).   Notes *Images captured and stored on drive  Diagnosis / Impression:  NFP, No IRF/SRF OU  Clinical management:  See below  Abbreviations: NFP - Normal foveal profile. CME - cystoid macular edema. PED - pigment epithelial detachment. IRF - intraretinal fluid. SRF - subretinal fluid. EZ - ellipsoid zone. ERM - epiretinal membrane. ORA - outer retinal atrophy. ORT - outer retinal tubulation. SRHM - subretinal hyper-reflective material                  ASSESSMENT/PLAN:    ICD-10-CM   1. Posterior vitreous detachment of right eye H43.811   2. Retinal edema H35.81 OCT, Retina - OU - Both Eyes  3. Pseudophakia of both eyes Z96.1     1. PVD / vitreous syneresis OD  Discussed findings and prognosis  No RT or RD on 360 scleral depressed exam  Reviewed s/s of RT/RD  Strict return precautions for any such RT/RD signs/symptoms  - F/U 1 week  2. No retinal edema on exam or OCT   3. Pseudophakia OU  - s/p CE/IOL OU ([place])  - s/p YAG cap OU  - doing well  - monitor   Ophthalmic Meds Ordered this visit:  No orders of the defined types were placed in this encounter.      Return in about 1 week (around 09/13/2017) for F/U PVD OD, DFE, OCT.  There are no Patient Instructions on file for this visit.   Explained the diagnoses, plan, and  follow up with the patient and they expressed understanding.  Patient expressed understanding of the importance of proper follow up care.   This document serves as a record of services personally performed by Karie Chimera, MD, PhD. It was created on their behalf by Laurian Brim, OA, an ophthalmic assistant. The creation of this record is the provider's dictation and/or activities during the visit.    Electronically signed by: Laurian Brim, OA  06.04.2019 3:57 PM   This document serves as a record of services personally performed by Karie Chimera, MD, PhD. It was created on their behalf by Virgilio Belling, COA, a certified ophthalmic assistant. The creation of this record is the provider's dictation and/or activities during the visit.  Electronically signed by: Virgilio Belling, COA  06.04.19 3:57 PM     Karie Chimera, M.D., Ph.D. Diseases & Surgery of the Retina and Vitreous [facility]      Abbreviations: M myopia (nearsighted); A astigmatism; H hyperopia (farsighted); P presbyopia; Mrx spectacle prescription;  CTL contact lenses; OD right eye; OS left eye; OU both eyes  XT exotropia; ET esotropia; PEK punctate epithelial keratitis; PEE punctate epithelial erosions; DES dry eye syndrome; MGD meibomian gland dysfunction; ATs artificial tears; PFAT's preservative free artificial tears; NSC nuclear sclerotic cataract; PSC posterior subcapsular cataract; ERM epi-retinal membrane; PVD posterior vitreous detachment; RD retinal detachment; DM diabetes mellitus; DR diabetic retinopathy; NPDR non-proliferative  diabetic retinopathy; PDR proliferative diabetic retinopathy; CSME clinically significant macular edema; DME diabetic macular edema; dbh dot blot hemorrhages; CWS cotton wool spot; POAG primary open angle glaucoma; C/D cup-to-disc ratio; HVF humphrey visual field; GVF goldmann visual field; OCT optical coherence tomography; IOP intraocular pressure; BRVO Klecka  retinal vein occlusion; CRVO central retinal vein occlusion; CRAO central retinal artery occlusion; BRAO Blaney retinal artery occlusion; RT retinal tear; SB scleral buckle; PPV pars plana vitrectomy; VH Vitreous hemorrhage; PRP panretinal laser photocoagulation; IVK intravitreal kenalog; VMT vitreomacular traction; MH Macular hole;  NVD neovascularization of the disc; NVE neovascularization elsewhere; AREDS age related eye disease study; ARMD age related macular degeneration; POAG primary open angle glaucoma; EBMD epithelial/anterior basement membrane dystrophy; ACIOL anterior chamber intraocular lens; IOL intraocular lens; PCIOL posterior chamber intraocular lens; Phaco/IOL phacoemulsification with intraocular lens placement; PRK photorefractive keratectomy; LASIK laser assisted in situ keratomileusis; HTN hypertension; DM diabetes mellitus; COPD chronic obstructive pulmonary disease

## 2017-09-08 ENCOUNTER — Encounter (INDEPENDENT_AMBULATORY_CARE_PROVIDER_SITE_OTHER): Payer: BLUE CROSS/BLUE SHIELD | Admitting: Ophthalmology

## 2017-09-15 ENCOUNTER — Encounter (INDEPENDENT_AMBULATORY_CARE_PROVIDER_SITE_OTHER): Payer: BLUE CROSS/BLUE SHIELD | Admitting: Ophthalmology

## 2017-10-13 DIAGNOSIS — R61 Generalized hyperhidrosis: Secondary | ICD-10-CM | POA: Diagnosis not present

## 2017-10-13 DIAGNOSIS — E039 Hypothyroidism, unspecified: Secondary | ICD-10-CM | POA: Diagnosis not present

## 2017-10-13 DIAGNOSIS — F419 Anxiety disorder, unspecified: Secondary | ICD-10-CM | POA: Diagnosis not present

## 2017-10-20 ENCOUNTER — Ambulatory Visit: Payer: Commercial Managed Care - PPO | Admitting: Neurology

## 2017-10-20 ENCOUNTER — Encounter

## 2018-05-05 ENCOUNTER — Encounter (HOSPITAL_COMMUNITY): Payer: Self-pay | Admitting: Nurse Practitioner

## 2018-05-05 ENCOUNTER — Ambulatory Visit (HOSPITAL_COMMUNITY)
Admission: RE | Admit: 2018-05-05 | Discharge: 2018-05-05 | Disposition: A | Discharge status: Home / Self Care | Payer: BLUE CROSS/BLUE SHIELD | Source: Ambulatory Visit | Attending: Nurse Practitioner | Admitting: Nurse Practitioner

## 2018-05-05 VITALS — BP 142/86 | HR 81 | Ht 62.0 in | Wt 147.0 lb

## 2018-05-05 DIAGNOSIS — Z8249 Family history of ischemic heart disease and other diseases of the circulatory system: Secondary | ICD-10-CM | POA: Insufficient documentation

## 2018-05-05 DIAGNOSIS — Z79899 Other long term (current) drug therapy: Secondary | ICD-10-CM | POA: Insufficient documentation

## 2018-05-05 DIAGNOSIS — E079 Disorder of thyroid, unspecified: Secondary | ICD-10-CM | POA: Insufficient documentation

## 2018-05-05 DIAGNOSIS — Z79890 Hormone replacement therapy (postmenopausal): Secondary | ICD-10-CM | POA: Insufficient documentation

## 2018-05-05 DIAGNOSIS — I480 Paroxysmal atrial fibrillation: Secondary | ICD-10-CM | POA: Diagnosis not present

## 2018-05-05 DIAGNOSIS — Z7982 Long term (current) use of aspirin: Secondary | ICD-10-CM | POA: Insufficient documentation

## 2018-05-05 DIAGNOSIS — F329 Major depressive disorder, single episode, unspecified: Secondary | ICD-10-CM | POA: Diagnosis not present

## 2018-05-05 DIAGNOSIS — Z881 Allergy status to other antibiotic agents status: Secondary | ICD-10-CM | POA: Diagnosis not present

## 2018-05-05 DIAGNOSIS — R0789 Other chest pain: Secondary | ICD-10-CM | POA: Diagnosis not present

## 2018-05-05 MED ORDER — DILTIAZEM HCL 30 MG PO TABS: ORAL_TABLET | ORAL | 1 refills | Status: AC

## 2018-05-05 MED ORDER — METOPROLOL SUCCINATE ER 25 MG PO TB24: 12.5000 mg | ORAL_TABLET | Freq: Every day | ORAL | 3 refills | Status: DC

## 2018-05-05 NOTE — Patient Instructions (Signed)
Cardizem 30mg  -- take 1 tablet every 4 hours AS NEEDED for AFIB heart rate >100 as long as blood pressure >100.   Start metoprolol 1/2 tablet once a day at bedtime

## 2018-05-05 NOTE — Progress Notes (Addendum)
Primary Care Physician: Merri BrunetteSmith, Candace, MD Referring Physician: Dr. Honor JunesSwain   Kristina Shelton is a 63 y.o. female with a h/o palpations for "years". She presented to her PCP office this am with feeling very lightheaded/palpitations and afib with v rate of 120 bpm. She was sent here urgently.  Now she presents in SR. She feels improved.   She denies tobacco use, alcohol use. No significant caffeine use. Works as Conservation officer, naturecashier at [employer]. No exercise program.  Today, she denies symptoms of palpitations, chest pain, shortness of breath, orthopnea, PND, lower extremity edema, dizziness, presyncope, syncope, or neurologic sequela. The patient is tolerating medications without difficulties and is otherwise without complaint today.   Past Medical History:  Diagnosis Date  . Chest pain   . Depression   . F/H: CAD (coronary artery disease)   . Thyroid disease    Past Surgical History:  Procedure Laterality Date  . ABDOMINAL HYSTERECTOMY    . CARPAL TUNNEL RELEASE    . CATARACT EXTRACTION    . EYE SURGERY    . NM MYOVIEW LTD  06/20/2012   normal    Current Outpatient Medications  Medication Sig Dispense Refill  . aspirin 81 MG tablet Take 81 mg by mouth daily.    . Biotin 1 MG CAPS Take 1 capsule by mouth daily.    . calcium carbonate (OS-CAL) 600 MG TABS Take 600 mg by mouth daily.    . citalopram (CELEXA) 20 MG tablet Take 20 mg by mouth at bedtime.    . clonazePAM (KLONOPIN) 1 MG tablet Take 0.5-1 mg by mouth at bedtime. Take 0.5 tablet Mon - Fri  And take 1 tablet on Sat and Sun.    Marland Kitchen. ibuprofen (ADVIL,MOTRIN) 200 MG tablet Take 200 mg by mouth every 8 (eight) hours as needed for pain.    Marland Kitchen. levothyroxine (SYNTHROID, LEVOTHROID) 150 MCG tablet Take 150 mcg by mouth daily.    . Multiple Vitamin (MULTIVITAMIN WITH MINERALS) TABS Take 1 tablet by mouth daily.    . Multiple Vitamins-Minerals (HAIR SKIN AND NAILS FORMULA PO) Take 1 tablet by mouth daily.    Marland Kitchen. OVER THE COUNTER MEDICATION Take 1  tablet by mouth daily as needed. For acid reflux  -  [employer] brand     No current facility-administered medications for this encounter.     Allergies  Allergen Reactions  . Macrobid [Nitrofurantoin Macrocrystal]     Fevers,aches,vomiting    Social History   Socioeconomic History  . Marital status: Divorced    Spouse name: Not on file  . Number of children: Not on file  . Years of education: Not on file  . Highest education level: Not on file  Occupational History  . Occupation: Producer, television/film/videocashier  Social Needs  . Financial resource strain: Not on file  . Food insecurity:    Worry: Not on file    Inability: Not on file  . Transportation needs:    Medical: Not on file    Non-medical: Not on file  Tobacco Use  . Smoking status: Never Smoker  . Smokeless tobacco: Never Used  Substance and Sexual Activity  . Alcohol use: No  . Drug use: Never  . Sexual activity: Not on file  Lifestyle  . Physical activity:    Days per week: Not on file    Minutes per session: Not on file  . Stress: Not on file  Relationships  . Social connections:    Talks on phone: Not on file  Gets together: Not on file    Attends religious service: Not on file    Active member of club or organization: Not on file    Attends meetings of clubs or organizations: Not on file    Relationship status: Not on file  . Intimate partner violence:    Fear of current or ex partner: Not on file    Emotionally abused: Not on file    Physically abused: Not on file    Forced sexual activity: Not on file  Other Topics Concern  . Not on file  Social History Narrative  . Not on file    Family History  Problem Relation Age of Onset  . Heart attack Father   . Heart attack Brother     ROS- All systems are reviewed and negative except as per the HPI above  Physical Exam: Vitals:   05/05/18 1241  BP: (!) 142/86  Pulse: 81  Weight: 66.7 kg  Height: 5\' 2"  (1.575 m)   Wt Readings from Last 3 Encounters:    05/05/18 66.7 kg  06/20/12 62.6 kg    Labs: Lab Results  Component Value Date   NA 144 05/15/2012   K 4.1 05/15/2012   CL 108 05/15/2012   CO2 25 05/15/2012   GLUCOSE 94 05/15/2012   BUN 13 05/15/2012   CREATININE 0.85 05/15/2012   CALCIUM 9.4 05/15/2012   No results found for: INR No results found for: CHOL, HDL, LDLCALC, TRIG   GEN- The patient is well appearing, alert and oriented x 3 today.   Head- normocephalic, atraumatic Eyes-  Sclera clear, conjunctiva pink Ears- hearing intact Oropharynx- clear Neck- supple, no JVP Lymph- no cervical lymphadenopathy Lungs- Clear to ausculation bilaterally, normal work of breathing Heart- Regular rate and rhythm, no murmurs, rubs or gallops, PMI not laterally displaced GI- soft, NT, ND, + BS Extremities- no clubbing, cyanosis, or edema MS- no significant deformity or atrophy Skin- no rash or lesion Psych- euthymic mood, full affect Neuro- strength and sensation are intact  EKG-EKG reviewed from PCP office and shows afib with v rate of 120 bpm ( see below addendum) Will be screened into EPIC EKG here shows SR at 81 bpm, normal intervals. Addendum- 3/18-Zio patch-06/02/18-Study Highlights   Sinus rhythm Frequent premature atrial contractions and nonsustained atrial tachycardia disorganized atrial activity also noted (Longest episode 38.9 seconds) No prolonged arrhythmias No AV block or pauses      Assessment and Plan: 1. Newly diagnosed paroxysmal afib General education re afib Triggers discussed, no lifestyle issues identified  Pt states that she has noted palpitations for some time Will start Toprol xl 25 mg 1/2 tab at hs to minimize palpitations I will also RX Cardizem 30 mg to use as pill in pocket for more symptomatic  episodes of afib  Echo   2. CHA2DS2VASc score of 1 (female) By guidelines does not meet criteria for daily anticoagulation   F/u in afib clinic after echo  Addendum- 3/18- I requested and  reviewed again EKG from  PCP office and now after seeing results of  heart monitor feel that this does not represent afib but more of the above as seen on heart monitor- runs of disorganized atrial activity. I reviewed with Dr. Johney Frame and he suggested  flecainide but she could not take because of celexa potentially causing  qtc prolongation, and pt did not want to switch antidepressant as she feels very stable on this. She initially on  metoprolol,heart rhythm improved. She  was switched to CCB as she c/o of insomnia on  BB. After the switch, she did not see improvement, therefore she was told to start back BB and if things did not improve, call and let us know.    Elvina Sidle [facility] [address] [place], [place] [address] [phone]

## 2018-05-18 ENCOUNTER — Ambulatory Visit (HOSPITAL_COMMUNITY)
Admission: RE | Admit: 2018-05-18 | Discharge: 2018-05-18 | Disposition: A | Discharge status: Home / Self Care | Payer: BLUE CROSS/BLUE SHIELD | Source: Ambulatory Visit | Attending: Nurse Practitioner | Admitting: Nurse Practitioner

## 2018-05-18 DIAGNOSIS — I480 Paroxysmal atrial fibrillation: Secondary | ICD-10-CM | POA: Diagnosis not present

## 2018-05-18 NOTE — Progress Notes (Signed)
  Echocardiogram 2D Echocardiogram has been performed.  Pieter Partridge 05/18/2018, 9:43 AM

## 2018-05-25 ENCOUNTER — Encounter (HOSPITAL_COMMUNITY): Payer: Self-pay | Admitting: Nurse Practitioner

## 2018-05-25 ENCOUNTER — Ambulatory Visit (HOSPITAL_COMMUNITY)
Admission: RE | Admit: 2018-05-25 | Discharge: 2018-05-25 | Disposition: A | Discharge status: Home / Self Care | Payer: BLUE CROSS/BLUE SHIELD | Source: Ambulatory Visit | Attending: Nurse Practitioner | Admitting: Nurse Practitioner

## 2018-05-25 VITALS — BP 128/82 | HR 70 | Ht 62.0 in | Wt 150.8 lb

## 2018-05-25 DIAGNOSIS — Z79899 Other long term (current) drug therapy: Secondary | ICD-10-CM | POA: Diagnosis not present

## 2018-05-25 DIAGNOSIS — I480 Paroxysmal atrial fibrillation: Secondary | ICD-10-CM | POA: Diagnosis not present

## 2018-05-25 DIAGNOSIS — Z881 Allergy status to other antibiotic agents status: Secondary | ICD-10-CM | POA: Insufficient documentation

## 2018-05-25 DIAGNOSIS — Z8249 Family history of ischemic heart disease and other diseases of the circulatory system: Secondary | ICD-10-CM | POA: Insufficient documentation

## 2018-05-25 DIAGNOSIS — F329 Major depressive disorder, single episode, unspecified: Secondary | ICD-10-CM | POA: Diagnosis not present

## 2018-05-25 DIAGNOSIS — I358 Other nonrheumatic aortic valve disorders: Secondary | ICD-10-CM | POA: Insufficient documentation

## 2018-05-25 DIAGNOSIS — Z79890 Hormone replacement therapy (postmenopausal): Secondary | ICD-10-CM | POA: Insufficient documentation

## 2018-05-25 DIAGNOSIS — E079 Disorder of thyroid, unspecified: Secondary | ICD-10-CM | POA: Insufficient documentation

## 2018-05-25 MED ORDER — METOPROLOL SUCCINATE ER 25 MG PO TB24: 25.0000 mg | ORAL_TABLET | Freq: Every day | ORAL | 3 refills | Status: DC

## 2018-05-25 NOTE — Patient Instructions (Signed)
Increase toprol to 25mg  once a day Call in 1 week with report

## 2018-05-25 NOTE — Progress Notes (Signed)
Primary Care Physician: Merri Brunette, MD Referring Physician: Dr. Honor Junes is a 63 y.o. female with a h/o palpations for "years". She presented to her PCP office this am with feeling very lightheaded/palpitations and afib with v rate of 120 bpm. She was sent here urgently.  Now she presents in SR. She feels improved.   She denies tobacco use, alcohol use. No significant caffeine use. Works as Conservation officer, nature at [employer]. No exercise program.  F/u in afib clinic 2/20. She still admits to some heart irregularity. No prolonged episodes. Echo done with normal structure.  Today, she denies symptoms of palpitations, chest pain, shortness of breath, orthopnea, PND, lower extremity edema, dizziness, presyncope, syncope, or neurologic sequela. The patient is tolerating medications without difficulties and is otherwise without complaint today.   Past Medical History:  Diagnosis Date  . Chest pain   . Depression   . F/H: CAD (coronary artery disease)   . Thyroid disease    Past Surgical History:  Procedure Laterality Date  . ABDOMINAL HYSTERECTOMY    . CARPAL TUNNEL RELEASE    . CATARACT EXTRACTION    . EYE SURGERY    . NM MYOVIEW LTD  06/20/2012   normal    Current Outpatient Medications  Medication Sig Dispense Refill  . Biotin 1 MG CAPS Take 1 capsule by mouth daily.    . calcium carbonate (OS-CAL) 600 MG TABS Take 600 mg by mouth daily.    . citalopram (CELEXA) 20 MG tablet Take 20 mg by mouth at bedtime.    . clonazePAM (KLONOPIN) 1 MG tablet Take 0.5-1 mg by mouth at bedtime. Take 0.5 tablet Mon - Fri  And take 1 tablet on Sat and Sun.    . diltiazem (CARDIZEM) 30 MG tablet Take 1 tablet every 4 hours AS NEEDED for AFIB heart rate >100 as long as blood pressure >100. 45 tablet 1  . ibuprofen (ADVIL,MOTRIN) 200 MG tablet Take 200 mg by mouth every 8 (eight) hours as needed for pain.    Marland Kitchen levothyroxine (SYNTHROID, LEVOTHROID) 150 MCG tablet Take 150 mcg by mouth daily.    .  metoprolol succinate (TOPROL XL) 25 MG 24 hr tablet Take 1 tablet (25 mg total) by mouth at bedtime. 30 tablet 3  . Multiple Vitamin (MULTIVITAMIN WITH MINERALS) TABS Take 1 tablet by mouth daily.    . Multiple Vitamins-Minerals (HAIR SKIN AND NAILS FORMULA PO) Take 1 tablet by mouth daily.    Marland Kitchen OVER THE COUNTER MEDICATION Take 1 tablet by mouth daily as needed. For acid reflux  -  [employer] brand     No current facility-administered medications for this encounter.     Allergies  Allergen Reactions  . Macrobid [Nitrofurantoin Macrocrystal]     Fevers,aches,vomiting    Social History   Socioeconomic History  . Marital status: Divorced    Spouse name: Not on file  . Number of children: Not on file  . Years of education: Not on file  . Highest education level: Not on file  Occupational History  . Occupation: Producer, television/film/video  . Financial resource strain: Not on file  . Food insecurity:    Worry: Not on file    Inability: Not on file  . Transportation needs:    Medical: Not on file    Non-medical: Not on file  Tobacco Use  . Smoking status: Never Smoker  . Smokeless tobacco: Never Used  Substance and Sexual Activity  . Alcohol  use: No  . Drug use: Never  . Sexual activity: Not on file  Lifestyle  . Physical activity:    Days per week: Not on file    Minutes per session: Not on file  . Stress: Not on file  Relationships  . Social connections:    Talks on phone: Not on file    Gets together: Not on file    Attends religious service: Not on file    Active member of club or organization: Not on file    Attends meetings of clubs or organizations: Not on file    Relationship status: Not on file  . Intimate partner violence:    Fear of current or ex partner: Not on file    Emotionally abused: Not on file    Physically abused: Not on file    Forced sexual activity: Not on file  Other Topics Concern  . Not on file  Social History Narrative  . Not on file     Family History  Problem Relation Age of Onset  . Heart attack Father   . Heart attack Brother     ROS- All systems are reviewed and negative except as per the HPI above  Physical Exam: Vitals:   05/25/18 0835  BP: 128/82  Pulse: 70  Weight: 68.4 kg  Height: 5\' 2"  (1.575 m)   Wt Readings from Last 3 Encounters:  05/25/18 68.4 kg  05/05/18 66.7 kg  06/20/12 62.6 kg    Labs: Lab Results  Component Value Date   NA 144 05/15/2012   K 4.1 05/15/2012   CL 108 05/15/2012   CO2 25 05/15/2012   GLUCOSE 94 05/15/2012   BUN 13 05/15/2012   CREATININE 0.85 05/15/2012   CALCIUM 9.4 05/15/2012   No results found for: INR No results found for: CHOL, HDL, LDLCALC, TRIG   GEN- The patient is well appearing, alert and oriented x 3 today.   Head- normocephalic, atraumatic Eyes-  Sclera clear, conjunctiva pink Ears- hearing intact Oropharynx- clear Neck- supple, no JVP Lymph- no cervical lymphadenopathy Lungs- Clear to ausculation bilaterally, normal work of breathing Heart- Regular rate and rhythm, no murmurs, rubs or gallops, PMI not laterally displaced GI- soft, NT, ND, + BS Extremities- no clubbing, cyanosis, or edema MS- no significant deformity or atrophy Skin- no rash or lesion Psych- euthymic mood, full affect Neuro- strength and sensation are intact  EKG-EKG  from PCP office showed afib with v rate of 120 bpm EKG- NSR at 70 bpm, Pr int 142 ms, qrs int 90 ms, qtc 397 ms Echo-IMPRESSIONS    1. The left ventricle has normal systolic function, with an ejection fraction of 55-60%. The cavity size was normal. Left ventricular diastolic Doppler parameters are consistent with pseudonormalization No evidence of left ventricular regional wall  motion abnormalities.  2. The right ventricle has normal systolic function. The cavity was normal. There is no increase in right ventricular wall thickness.  3. The mitral valve is normal in structure. No evidence of mitral valve  stenosis. No regurgitation.  4. The tricuspid valve is normal in structure.  5. The aortic valve is tricuspid There is mild calcification of the aortic valve. No stenosis.  6. The pulmonic valve was normal in structure.  7. The aortic root and ascending aorta are normal in size and structure.  8. No evidence of left ventricular regional wall motion abnormalities.  9. Right atrial pressure is estimated at 3 mmHg. 10. PA systolic pressure 24 mmHg.  FINDINGS  Left Ventricle: The left ventricle has normal systolic function, with an ejection fraction of 55-60%. The cavity size was normal. There is no increase in left ventricular wall thickness. Left ventricular diastolic Doppler parameters are consistent with  pseudonormalization No evidence of left ventricular regional wall motion abnormalities.. Right Ventricle: The right ventricle has normal systolic function. The cavity was normal. There is no increase in right ventricular wall thickness. Left Atrium: left atrial size was normal in size Right Atrium: right atrial size was normal in size Right atrial pressure is estimated at 3 mmHg. Interatrial Septum: No atrial level shunt detected by color flow Doppler. Pericardium: There is no evidence of pericardial effusion. Mitral Valve: The mitral valve is normal in structure. Mitral valve regurgitation is not visualized by color flow Doppler. No evidence of mitral valve stenosis. Tricuspid Valve: The tricuspid valve is normal in structure. Tricuspid valve regurgitation is trivial by color flow Doppler. Aortic Valve: The aortic valve is tricuspid There is mild calcification of the aortic valve. Aortic valve regurgitation was not visualized by color flow Doppler. Pulmonic Valve: The pulmonic valve was normal in structure. Pulmonic valve regurgitation is not visualized by color flow Doppler. Aorta: The aortic root and ascending aorta are normal in size and structure. Venous: The inferior vena cava is normal  in size with greater than 50% respiratory variability.    Assessment and Plan: 1. Newly diagnosed paroxysmal afib General education re afib reviewed Still feels some irregularity   Discussed wearing a zio patch for a week to further evaluate but she wants to wait Will increase Toprol xl 25 mg to a full tablet at hs to minimize palpitations If this does not settle palpitations down, will place a monitor for a week, she will call back in a week to report symptoms She also has RX Cardizem 30 mg to use as pill in pocket for more symptomatic  episodes of afib    2. CHA2DS2VASc score of 1 (female) By guidelines does not meet criteria for daily anticoagulation   As above  Kristina Shelton C. [facility] [address] [place], [place] [address] [phone]

## 2018-05-30 ENCOUNTER — Telehealth (HOSPITAL_COMMUNITY): Payer: Self-pay | Admitting: *Deleted

## 2018-05-30 MED ORDER — DILTIAZEM HCL ER COATED BEADS 120 MG PO CP24: 120.0000 mg | ORAL_CAPSULE | Freq: Every day | ORAL | 3 refills | Status: DC

## 2018-05-30 NOTE — Telephone Encounter (Signed)
Pt called in stating since being on metoprolol she is having issues sleeping. Pt would like to switch to different medication and see if her symptoms improve. Discussed with Rudi Coco NP will switch from metoprolol to cardizem cd 120mg  once a day. Pt in agreement. Rx sent

## 2018-06-02 ENCOUNTER — Other Ambulatory Visit (HOSPITAL_COMMUNITY): Payer: Self-pay | Admitting: *Deleted

## 2018-06-02 ENCOUNTER — Ambulatory Visit (HOSPITAL_COMMUNITY)
Admission: RE | Admit: 2018-06-02 | Discharge: 2018-06-02 | Disposition: A | Discharge status: Home / Self Care | Payer: BLUE CROSS/BLUE SHIELD | Source: Ambulatory Visit | Attending: Nurse Practitioner | Admitting: Nurse Practitioner

## 2018-06-02 DIAGNOSIS — I480 Paroxysmal atrial fibrillation: Secondary | ICD-10-CM

## 2018-06-02 MED ORDER — DILTIAZEM HCL ER COATED BEADS 120 MG PO CP24: 120.0000 mg | ORAL_CAPSULE | Freq: Two times a day (BID) | ORAL | 3 refills | Status: DC

## 2018-06-02 NOTE — Patient Instructions (Signed)
Increase cardizem to 120mg  twice a day  Take off zio patch 3/6

## 2018-06-02 NOTE — Progress Notes (Signed)
Patient states her sleep is no better on cardizem but melatonin helps. Continues with daily palpitations  -- discussed with Rudi Coco NP - will try increasing cardizem to 120mg  bid and zio patch for 7 days.

## 2018-06-14 DIAGNOSIS — R002 Palpitations: Secondary | ICD-10-CM | POA: Diagnosis not present

## 2018-06-15 ENCOUNTER — Other Ambulatory Visit (HOSPITAL_COMMUNITY): Payer: Self-pay | Admitting: *Deleted

## 2018-06-16 ENCOUNTER — Telehealth (HOSPITAL_COMMUNITY): Payer: Self-pay | Admitting: *Deleted

## 2018-06-16 MED ORDER — METOPROLOL SUCCINATE ER 25 MG PO TB24: 25.0000 mg | ORAL_TABLET | Freq: Every day | ORAL | Status: DC

## 2018-06-16 NOTE — Telephone Encounter (Signed)
Notified of results of monitor per donna carroll np - recommended starting flecainide 50mg  BID but would have to come off celexa due to interaction. Patient not interested in changing celexa to different medication - would like to try switching back to metoprolol from cardizem. She will do this over the weekend and call on Monday with report.

## 2018-06-21 NOTE — Addendum Note (Signed)
Encounter addended by: Newman Nip, NP on: 06/21/2018 12:09 PM  Actions taken: Clinical Note Signed

## 2018-07-17 ENCOUNTER — Telehealth (HOSPITAL_COMMUNITY): Payer: Self-pay | Admitting: *Deleted

## 2018-07-17 MED ORDER — METOPROLOL SUCCINATE ER 25 MG PO TB24: ORAL_TABLET | ORAL | Status: DC

## 2018-07-17 NOTE — Telephone Encounter (Signed)
Pt called in stating she is having increased episodes of elevated HRs. 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  Pt states when she checks her HR when having an episode it can be upwards of 160s. Her BP becomes elevated during episodes as well. She did attempt to use diltiazem 30mg  for episodes but did not feel like this helped her symptoms. Her HR when in NSR is in the 80-90s. Discussed with Rudi Coco NP - will try increasing metoprolol to 1/2 tablet in the AM and 1 tablet in the PM and see if this reduces episodes. Pt will call with report later in week.

## 2018-07-25 ENCOUNTER — Other Ambulatory Visit (HOSPITAL_COMMUNITY): Payer: Self-pay | Admitting: Nurse Practitioner

## 2018-08-01 ENCOUNTER — Other Ambulatory Visit: Payer: Self-pay

## 2018-08-01 ENCOUNTER — Ambulatory Visit (HOSPITAL_COMMUNITY)
Admission: RE | Admit: 2018-08-01 | Discharge: 2018-08-01 | Disposition: A | Discharge status: Home / Self Care | Payer: BLUE CROSS/BLUE SHIELD | Source: Ambulatory Visit | Attending: Nurse Practitioner | Admitting: Nurse Practitioner

## 2018-08-01 DIAGNOSIS — R002 Palpitations: Secondary | ICD-10-CM | POA: Diagnosis not present

## 2018-08-01 NOTE — Progress Notes (Signed)
Electrophysiology TeleHealth Note   Due to national recommendations of social distancing due to COVID 19, Audio/video telehealth visit is felt to be most appropriate for this patient at this time.  See MyChart message/consent below from today for patient consent regarding telehealth for the At[facility] F[facility]illation Clinic.    Date:  08/01/2018   ID:  Kristina Shelton, DOB June 24, 1955, MRN [id number]  Location: home  Provider location: [address] [place], [place] [address] Evaluation Performed: Add on for spells of tachycardia  PCP:  Merri Brunette, MD  Primary Cardiologist:   None  Primary Electrophysiologist: none  CC: My heart speeds up and I feel lightheaded   History of Present Illness: Kristina Shelton is a 63 y.o. 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   The patient is an add on for increased spells of tachycardia today associated with lightheadedness. She was placed on BB when I initially saw her.  I saw her late January for an ekg that read afib from PCP. At the tine I felt it was afib as well. Her CHA2DS2VASc score was one so she did not qualify for anticoagulation per guidelines. I placed a monitor and it was read as disorganized atrial activity and atrial tach, usually very short runs, but make pt very symptomatic. I went back and compared to original ekg and thought on second look that it looked like the arrhythmia's noted on the monitor. Dr. Johney Frame at the time thought flecainide may help her. However, celexa is contraindicated with flecainide and the pt was unwilling to change celexa as this drug really helped to stabilize her past  mental issues. She also tried cardizem after BB was started but did not feel this worked as good as BB.   Today, she denies symptoms of   chest pain, shortness of breath, orthopnea, PND, lower extremity edema, claudication, dizziness, presyncope, syncope, bleeding, or neurologic  sequela.+ for palpitations and  lightheadedness.The patient is tolerating medications without difficulties and is otherwise without complaint today.   she denies symptoms of cough, fevers, chills, or new SOB worrisome for COVID 19.   Atrial Fibrillation Risk Factors:  she does not have symptoms or diagnosis of sleep apnea.. she does not have a history of rheumatic fever. she does not have a history of alcohol use. The patient does not have a history of early familial atrial fibrillation or other arrhythmias.  she has a BMI of There is no height or weight on file to calculate BMI.. There were no vitals filed for this visit.  Past Medical History:  Diagnosis Date  . Chest pain   . Depression   . F/H: CAD (coronary artery disease)   . Thyroid disease    Past Surgical History:  Procedure Laterality Date  . ABDOMINAL HYSTERECTOMY    . CARPAL TUNNEL RELEASE    . CATARACT EXTRACTION    . EYE SURGERY    . NM MYOVIEW LTD  06/20/2012   normal     Current Outpatient Medications  Medication Sig Dispense Refill  . Biotin 1 MG CAPS Take 1 capsule by mouth daily.    . calcium carbonate (OS-CAL) 600 MG TABS Take 600 mg by mouth daily.    . citalopram (CELEXA) 20 MG tablet Take 20 mg by mouth at bedtime.    . clonazePAM (KLONOPIN) 1 MG tablet Take 0.5-1 mg by mouth at bedtime. Take 0.5 tablet Mon - Fri  And  take 1 tablet on Sat and Sun.    . diltiazem (CARDIZEM) 30 MG tablet Take 1 tablet every 4 hours AS NEEDED for AFIB heart rate >100 as long as blood pressure >100. 45 tablet 1  . ibuprofen (ADVIL,MOTRIN) 200 MG tablet Take 200 mg by mouth every 8 (eight) hours as needed for pain.    Marland Kitchen levothyroxine (SYNTHROID, LEVOTHROID) 150 MCG tablet Take 150 mcg by mouth daily.    . metoprolol succinate (TOPROL-XL) 25 MG 24 hr tablet Take 1/2 tablet in the AM and 1 tablet in the PM 45 tablet 6  . Multiple Vitamin (MULTIVITAMIN WITH MINERALS) TABS Take 1 tablet by mouth daily.    . Multiple  Vitamins-Minerals (HAIR SKIN AND NAILS FORMULA PO) Take 1 tablet by mouth daily.    Marland Kitchen OVER THE COUNTER MEDICATION Take 1 tablet by mouth daily as needed. For acid reflux  -  [employer] brand     No current facility-administered medications for this encounter.     Allergies:   Macrobid [nitrofurantoin macrocrystal]   Social History:  The patient  reports that she has never smoked. She has never used smokeless tobacco. She reports that she does not drink alcohol or use drugs.   Family History:  The patient's  family history includes Heart attack in her brother and father.    ROS:  Please see the history of present illness.   All other systems are personally reviewed and negative.   Exam: Well appearing, alert and conversant, regular work of breathing,  good skin color   Recent Labs: No results found for requested labs within last 8760 hours.  personally reviewed    Other studies personally reviewed: Prior  monitor ekg's and notes     ASSESSMENT AND PLAN:  1.  SVT monitor showed disorganized atrial activity and atrial tach Metoprolol has helped some but is still symptomatic unwilling to take flecainide as is means she would have to change her antidepressant and she is not willing to do that For now will change the 25 mg of metoprolol to am and the 1/2 tab to pm Will refer to Dr. Ladona Ridgel to see if candidate for EP study to see if she would be SVT ablation candidate   This patients CHA2DS2-VASc Score and unadjusted Ischemic Stroke Rate (% per year) is equal to 0.6 % stroke rate/year from a score of 1  Above score calculated as 1 point each if present [CHF, HTN, DM, Vascular=MI/PAD/Aortic Plaque, Age if 65-74, or Female] Above score calculated as 2 points each if present [Age > 75, or Stroke/TIA/TE]    COVID screen The patient does not have any symptoms that suggest any further testing/ screening at this time.  She is not able to socially distance as she works at [employer] as a  Conservation officer, nature  Follow-up:  One month with Dr. Ladona Ridgel  Current medicines are reviewed at length with the patient today.   The patient does not have concerns regarding her medicines.  The following changes were made today take bigger dose of BB in the am, smaller dose in the pm   Labs/ tests ordered today include: none No orders of the defined types were placed in this encounter.   Patient Risk:  after full review of this patients clinical status, I feel that they are at moderate risk at this time.   Today, I have spent 20 minutes with the patient with telehealth technology discussing above  Signed, Rudi Coco NP 08/01/2018 4:04 PM  [facility] [address] [place], [place] [address] [phone]   I hereby voluntarily request, consent and authorize the Atrial Fibrillation Clinic and its employed or contracted physicians, physician assistants, nurse practitioners or other licensed health care professionals (the Practitioner), to provide me with telemedicine health care services (the "Services") as deemed necessary by the treating Practitioner. I acknowledge and consent to receive the Services by the Practitioner via telemedicine. I understand that the telemedicine visit will involve communicating with the Practitioner through live audiovisual communication technology and the disclosure of certain medical information by electronic transmission. I acknowledge that I have been given the opportunity to request an in-person assessment or other available alternative prior to the telemedicine visit and am voluntarily participating in the telemedicine visit.   I understand that I have the right to withhold or withdraw my consent to the use of telemedicine in the course of my care at any time, without affecting my right to future care or treatment, and that the Practitioner or I may terminate the telemedicine visit at any time. I understand that I have the right to inspect all  information obtained and/or recorded in the course of the telemedicine visit and may receive copies of available information for a reasonable fee.  I understand that some of the potential risks of receiving the Services via telemedicine include:   Delay or interruption in medical evaluation due to technological equipment failure or disruption;  Information transmitted may not be sufficient (e.g. poor resolution of images) to allow for appropriate medical decision making by the Practitioner; and/or  In rare instances, security protocols could fail, causing a breach of personal health information.   Furthermore, I acknowledge that it is my responsibility to provide information about my medical history, conditions and care that is complete and accurate to the best of my ability. I acknowledge that Practitioner's advice, recommendations, and/or decision may be based on factors not within their control, such as incomplete or inaccurate data provided by me or distortions of diagnostic images or specimens that may result from electronic transmissions. I understand that the practice of medicine is not an exact science and that Practitioner makes no warranties or guarantees regarding treatment outcomes. I acknowledge that I will receive a copy of this consent concurrently upon execution via email to the email address I last provided but may also request a printed copy by calling the office of the Atrial Fibrillation Clinic.  I understand that my insurance will be billed for this visit.   I have read or had this consent read to me.  I understand the contents of this consent, which adequately explains the benefits and risks of the Services being provided via telemedicine.  I have been provided ample opportunity to ask questions regarding this consent and the Services and have had my questions answered to my satisfaction.  I give my informed consent for the services to be provided through the use of telemedicine in  my medical care  By participating in this telemedicine visit I agree to the above.

## 2018-08-02 ENCOUNTER — Other Ambulatory Visit (HOSPITAL_COMMUNITY): Payer: Self-pay | Admitting: *Deleted

## 2018-08-02 MED ORDER — METOPROLOL SUCCINATE ER 25 MG PO TB24: ORAL_TABLET | ORAL | 6 refills | Status: DC

## 2018-08-03 ENCOUNTER — Telehealth: Payer: Self-pay | Admitting: Internal Medicine

## 2018-08-03 NOTE — Telephone Encounter (Signed)
New message   [facility] for pt to call back about scheduling appt per RN on May 6th. Need to speak with patient when she calls back. Please reach out via secure chat.

## 2018-08-04 NOTE — Telephone Encounter (Signed)
Scheduled for may 6 

## 2018-08-09 ENCOUNTER — Ambulatory Visit: Payer: BLUE CROSS/BLUE SHIELD | Admitting: Internal Medicine

## 2018-08-31 DIAGNOSIS — I471 Supraventricular tachycardia: Secondary | ICD-10-CM | POA: Diagnosis not present

## 2018-08-31 DIAGNOSIS — F419 Anxiety disorder, unspecified: Secondary | ICD-10-CM | POA: Diagnosis not present

## 2018-08-31 DIAGNOSIS — E039 Hypothyroidism, unspecified: Secondary | ICD-10-CM | POA: Diagnosis not present

## 2019-04-12 DIAGNOSIS — R197 Diarrhea, unspecified: Secondary | ICD-10-CM | POA: Diagnosis not present

## 2019-04-12 DIAGNOSIS — B349 Viral infection, unspecified: Secondary | ICD-10-CM | POA: Diagnosis not present

## 2019-05-11 DIAGNOSIS — H40013 Open angle with borderline findings, low risk, bilateral: Secondary | ICD-10-CM | POA: Diagnosis not present

## 2019-05-11 DIAGNOSIS — Z961 Presence of intraocular lens: Secondary | ICD-10-CM | POA: Diagnosis not present

## 2019-06-23 ENCOUNTER — Other Ambulatory Visit (HOSPITAL_COMMUNITY): Payer: Self-pay | Admitting: Nurse Practitioner

## 2019-11-11 ENCOUNTER — Other Ambulatory Visit (HOSPITAL_COMMUNITY): Payer: Self-pay | Admitting: Internal Medicine

## 2019-11-15 DIAGNOSIS — I471 Supraventricular tachycardia: Secondary | ICD-10-CM | POA: Diagnosis not present

## 2019-11-15 DIAGNOSIS — E039 Hypothyroidism, unspecified: Secondary | ICD-10-CM | POA: Diagnosis not present

## 2019-11-15 DIAGNOSIS — F419 Anxiety disorder, unspecified: Secondary | ICD-10-CM | POA: Diagnosis not present

## 2019-11-15 DIAGNOSIS — G4700 Insomnia, unspecified: Secondary | ICD-10-CM | POA: Diagnosis not present

## 2020-07-24 DIAGNOSIS — E7800 Pure hypercholesterolemia, unspecified: Secondary | ICD-10-CM | POA: Diagnosis not present

## 2020-07-24 DIAGNOSIS — E039 Hypothyroidism, unspecified: Secondary | ICD-10-CM | POA: Diagnosis not present

## 2020-07-24 DIAGNOSIS — R946 Abnormal results of thyroid function studies: Secondary | ICD-10-CM | POA: Diagnosis not present

## 2020-09-24 DIAGNOSIS — E039 Hypothyroidism, unspecified: Secondary | ICD-10-CM | POA: Diagnosis not present

## 2020-10-28 DIAGNOSIS — E039 Hypothyroidism, unspecified: Secondary | ICD-10-CM | POA: Diagnosis not present

## 2020-12-17 DIAGNOSIS — E039 Hypothyroidism, unspecified: Secondary | ICD-10-CM | POA: Diagnosis not present

## 2021-03-25 DIAGNOSIS — F419 Anxiety disorder, unspecified: Secondary | ICD-10-CM | POA: Diagnosis not present

## 2021-03-25 DIAGNOSIS — E039 Hypothyroidism, unspecified: Secondary | ICD-10-CM | POA: Diagnosis not present

## 2021-03-25 DIAGNOSIS — I471 Supraventricular tachycardia: Secondary | ICD-10-CM | POA: Diagnosis not present

## 2021-10-12 DIAGNOSIS — Z0000 Encounter for general adult medical examination without abnormal findings: Secondary | ICD-10-CM | POA: Diagnosis not present

## 2021-10-12 DIAGNOSIS — Z23 Encounter for immunization: Secondary | ICD-10-CM | POA: Diagnosis not present

## 2021-10-12 DIAGNOSIS — E7800 Pure hypercholesterolemia, unspecified: Secondary | ICD-10-CM | POA: Diagnosis not present

## 2021-10-12 DIAGNOSIS — I471 Supraventricular tachycardia: Secondary | ICD-10-CM | POA: Diagnosis not present

## 2021-10-12 DIAGNOSIS — E039 Hypothyroidism, unspecified: Secondary | ICD-10-CM | POA: Diagnosis not present

## 2021-10-12 DIAGNOSIS — F419 Anxiety disorder, unspecified: Secondary | ICD-10-CM | POA: Diagnosis not present

## 2021-12-11 DIAGNOSIS — E039 Hypothyroidism, unspecified: Secondary | ICD-10-CM | POA: Diagnosis not present

## 2022-04-06 DIAGNOSIS — E7800 Pure hypercholesterolemia, unspecified: Secondary | ICD-10-CM | POA: Diagnosis not present

## 2022-04-06 DIAGNOSIS — F329 Major depressive disorder, single episode, unspecified: Secondary | ICD-10-CM | POA: Diagnosis not present

## 2022-04-06 DIAGNOSIS — F419 Anxiety disorder, unspecified: Secondary | ICD-10-CM | POA: Diagnosis not present

## 2022-04-06 DIAGNOSIS — D72820 Lymphocytosis (symptomatic): Secondary | ICD-10-CM | POA: Diagnosis not present

## 2022-04-06 DIAGNOSIS — E039 Hypothyroidism, unspecified: Secondary | ICD-10-CM | POA: Diagnosis not present

## 2022-10-18 DIAGNOSIS — E039 Hypothyroidism, unspecified: Secondary | ICD-10-CM | POA: Diagnosis not present

## 2022-11-18 DIAGNOSIS — E039 Hypothyroidism, unspecified: Secondary | ICD-10-CM | POA: Diagnosis not present

## 2023-03-07 DIAGNOSIS — E7800 Pure hypercholesterolemia, unspecified: Secondary | ICD-10-CM | POA: Diagnosis not present

## 2023-03-07 DIAGNOSIS — Z1211 Encounter for screening for malignant neoplasm of colon: Secondary | ICD-10-CM | POA: Diagnosis not present

## 2023-03-07 DIAGNOSIS — F419 Anxiety disorder, unspecified: Secondary | ICD-10-CM | POA: Diagnosis not present

## 2023-03-07 DIAGNOSIS — F325 Major depressive disorder, single episode, in full remission: Secondary | ICD-10-CM | POA: Diagnosis not present

## 2023-03-07 DIAGNOSIS — I4719 Other supraventricular tachycardia: Secondary | ICD-10-CM | POA: Diagnosis not present

## 2023-03-07 DIAGNOSIS — E039 Hypothyroidism, unspecified: Secondary | ICD-10-CM | POA: Diagnosis not present

## 2023-03-07 DIAGNOSIS — R944 Abnormal results of kidney function studies: Secondary | ICD-10-CM | POA: Diagnosis not present

## 2023-03-07 DIAGNOSIS — K219 Gastro-esophageal reflux disease without esophagitis: Secondary | ICD-10-CM | POA: Diagnosis not present

## 2023-03-15 DIAGNOSIS — Z1211 Encounter for screening for malignant neoplasm of colon: Secondary | ICD-10-CM | POA: Diagnosis not present

## 2023-03-15 DIAGNOSIS — Z1212 Encounter for screening for malignant neoplasm of rectum: Secondary | ICD-10-CM | POA: Diagnosis not present

## 2023-04-05 DIAGNOSIS — E039 Hypothyroidism, unspecified: Secondary | ICD-10-CM | POA: Diagnosis not present

## 2023-05-04 DIAGNOSIS — E039 Hypothyroidism, unspecified: Secondary | ICD-10-CM | POA: Diagnosis not present

## 2023-06-07 DIAGNOSIS — E039 Hypothyroidism, unspecified: Secondary | ICD-10-CM | POA: Diagnosis not present

## 2023-06-09 DIAGNOSIS — R1013 Epigastric pain: Secondary | ICD-10-CM | POA: Diagnosis not present

## 2023-06-09 DIAGNOSIS — F324 Major depressive disorder, single episode, in partial remission: Secondary | ICD-10-CM | POA: Diagnosis not present

## 2023-06-09 DIAGNOSIS — E039 Hypothyroidism, unspecified: Secondary | ICD-10-CM | POA: Diagnosis not present

## 2023-06-09 DIAGNOSIS — R140 Abdominal distension (gaseous): Secondary | ICD-10-CM | POA: Diagnosis not present

## 2023-06-09 DIAGNOSIS — F419 Anxiety disorder, unspecified: Secondary | ICD-10-CM | POA: Diagnosis not present

## 2023-06-23 DIAGNOSIS — H5203 Hypermetropia, bilateral: Secondary | ICD-10-CM | POA: Diagnosis not present

## 2023-07-06 DIAGNOSIS — R140 Abdominal distension (gaseous): Secondary | ICD-10-CM | POA: Diagnosis not present

## 2023-07-06 DIAGNOSIS — E039 Hypothyroidism, unspecified: Secondary | ICD-10-CM | POA: Diagnosis not present

## 2023-08-08 ENCOUNTER — Ambulatory Visit: Payer: BLUE CROSS/BLUE SHIELD | Admitting: Cardiology

## 2023-09-01 DIAGNOSIS — R42 Dizziness and giddiness: Secondary | ICD-10-CM | POA: Diagnosis not present

## 2023-09-28 DIAGNOSIS — F419 Anxiety disorder, unspecified: Secondary | ICD-10-CM | POA: Diagnosis not present

## 2023-09-28 DIAGNOSIS — Z0000 Encounter for general adult medical examination without abnormal findings: Secondary | ICD-10-CM | POA: Diagnosis not present

## 2023-09-28 DIAGNOSIS — I4719 Other supraventricular tachycardia: Secondary | ICD-10-CM | POA: Diagnosis not present

## 2023-09-28 DIAGNOSIS — E7800 Pure hypercholesterolemia, unspecified: Secondary | ICD-10-CM | POA: Diagnosis not present

## 2023-09-28 DIAGNOSIS — E039 Hypothyroidism, unspecified: Secondary | ICD-10-CM | POA: Diagnosis not present

## 2023-09-28 DIAGNOSIS — Z1331 Encounter for screening for depression: Secondary | ICD-10-CM | POA: Diagnosis not present

## 2023-10-05 DIAGNOSIS — E875 Hyperkalemia: Secondary | ICD-10-CM | POA: Diagnosis not present

## 2023-12-26 DIAGNOSIS — M25561 Pain in right knee: Secondary | ICD-10-CM | POA: Diagnosis not present

## 2023-12-26 DIAGNOSIS — M170 Bilateral primary osteoarthritis of knee: Secondary | ICD-10-CM | POA: Diagnosis not present
